# Patient Record
Sex: Male | Born: 1940
Health system: Southern US, Community
[De-identification: ages and names within clinical notes are randomized; demographics above are authoritative.]

## PROBLEM LIST (undated history)

## (undated) DIAGNOSIS — M109 Gout, unspecified: Secondary | ICD-10-CM

## (undated) DIAGNOSIS — I10 Essential (primary) hypertension: Secondary | ICD-10-CM

## (undated) DIAGNOSIS — E7800 Pure hypercholesterolemia, unspecified: Secondary | ICD-10-CM

## (undated) HISTORY — PX: SHOULDER SURGERY: SHX246

## (undated) HISTORY — PX: KNEE SURGERY: SHX244

---

## 2002-08-28 ENCOUNTER — Ambulatory Visit (HOSPITAL_COMMUNITY): Admission: RE | Admit: 2002-08-28 | Discharge: 2002-08-28 | Payer: Self-pay | Admitting: General Surgery

## 2009-09-20 ENCOUNTER — Ambulatory Visit: Payer: Self-pay | Admitting: Cardiology

## 2014-02-26 DIAGNOSIS — I10 Essential (primary) hypertension: Secondary | ICD-10-CM | POA: Diagnosis not present

## 2014-02-26 DIAGNOSIS — S9032XA Contusion of left foot, initial encounter: Secondary | ICD-10-CM | POA: Diagnosis not present

## 2014-03-12 DIAGNOSIS — N3000 Acute cystitis without hematuria: Secondary | ICD-10-CM | POA: Diagnosis not present

## 2014-03-12 DIAGNOSIS — R61 Generalized hyperhidrosis: Secondary | ICD-10-CM | POA: Diagnosis not present

## 2014-03-12 DIAGNOSIS — R110 Nausea: Secondary | ICD-10-CM | POA: Diagnosis not present

## 2014-03-12 DIAGNOSIS — Z7982 Long term (current) use of aspirin: Secondary | ICD-10-CM | POA: Diagnosis not present

## 2014-03-12 DIAGNOSIS — N390 Urinary tract infection, site not specified: Secondary | ICD-10-CM | POA: Diagnosis not present

## 2014-03-12 DIAGNOSIS — R42 Dizziness and giddiness: Secondary | ICD-10-CM | POA: Diagnosis not present

## 2014-03-12 DIAGNOSIS — R55 Syncope and collapse: Secondary | ICD-10-CM | POA: Diagnosis not present

## 2014-03-12 DIAGNOSIS — Z87442 Personal history of urinary calculi: Secondary | ICD-10-CM | POA: Diagnosis not present

## 2014-03-12 DIAGNOSIS — Z8673 Personal history of transient ischemic attack (TIA), and cerebral infarction without residual deficits: Secondary | ICD-10-CM | POA: Diagnosis not present

## 2014-03-12 DIAGNOSIS — I10 Essential (primary) hypertension: Secondary | ICD-10-CM | POA: Diagnosis not present

## 2014-03-12 DIAGNOSIS — M2140 Flat foot [pes planus] (acquired), unspecified foot: Secondary | ICD-10-CM | POA: Diagnosis not present

## 2014-03-12 DIAGNOSIS — R001 Bradycardia, unspecified: Secondary | ICD-10-CM | POA: Diagnosis not present

## 2014-03-12 DIAGNOSIS — M25562 Pain in left knee: Secondary | ICD-10-CM | POA: Diagnosis not present

## 2014-03-12 DIAGNOSIS — R748 Abnormal levels of other serum enzymes: Secondary | ICD-10-CM | POA: Diagnosis not present

## 2014-03-12 DIAGNOSIS — E780 Pure hypercholesterolemia: Secondary | ICD-10-CM | POA: Diagnosis not present

## 2014-03-12 DIAGNOSIS — Z881 Allergy status to other antibiotic agents status: Secondary | ICD-10-CM | POA: Diagnosis not present

## 2014-03-12 DIAGNOSIS — M179 Osteoarthritis of knee, unspecified: Secondary | ICD-10-CM | POA: Diagnosis not present

## 2014-03-12 DIAGNOSIS — N289 Disorder of kidney and ureter, unspecified: Secondary | ICD-10-CM | POA: Diagnosis not present

## 2014-03-12 DIAGNOSIS — N19 Unspecified kidney failure: Secondary | ICD-10-CM | POA: Diagnosis not present

## 2014-03-12 DIAGNOSIS — Z880 Allergy status to penicillin: Secondary | ICD-10-CM | POA: Diagnosis not present

## 2014-03-12 DIAGNOSIS — M109 Gout, unspecified: Secondary | ICD-10-CM | POA: Diagnosis not present

## 2014-03-13 DIAGNOSIS — M19072 Primary osteoarthritis, left ankle and foot: Secondary | ICD-10-CM | POA: Diagnosis not present

## 2014-03-13 DIAGNOSIS — N390 Urinary tract infection, site not specified: Secondary | ICD-10-CM | POA: Diagnosis not present

## 2014-03-13 DIAGNOSIS — M25472 Effusion, left ankle: Secondary | ICD-10-CM | POA: Diagnosis not present

## 2014-03-14 DIAGNOSIS — N390 Urinary tract infection, site not specified: Secondary | ICD-10-CM | POA: Diagnosis not present

## 2014-03-21 DIAGNOSIS — S82892G Other fracture of left lower leg, subsequent encounter for closed fracture with delayed healing: Secondary | ICD-10-CM | POA: Diagnosis not present

## 2014-03-21 DIAGNOSIS — E780 Pure hypercholesterolemia: Secondary | ICD-10-CM | POA: Diagnosis not present

## 2014-03-21 DIAGNOSIS — M109 Gout, unspecified: Secondary | ICD-10-CM | POA: Diagnosis not present

## 2014-03-21 DIAGNOSIS — I10 Essential (primary) hypertension: Secondary | ICD-10-CM | POA: Diagnosis not present

## 2014-04-10 DIAGNOSIS — M7662 Achilles tendinitis, left leg: Secondary | ICD-10-CM | POA: Diagnosis not present

## 2014-04-16 DIAGNOSIS — R262 Difficulty in walking, not elsewhere classified: Secondary | ICD-10-CM | POA: Diagnosis not present

## 2014-04-16 DIAGNOSIS — M7662 Achilles tendinitis, left leg: Secondary | ICD-10-CM | POA: Diagnosis not present

## 2014-04-16 DIAGNOSIS — M25572 Pain in left ankle and joints of left foot: Secondary | ICD-10-CM | POA: Diagnosis not present

## 2014-04-16 DIAGNOSIS — M6281 Muscle weakness (generalized): Secondary | ICD-10-CM | POA: Diagnosis not present

## 2014-04-18 DIAGNOSIS — R262 Difficulty in walking, not elsewhere classified: Secondary | ICD-10-CM | POA: Diagnosis not present

## 2014-04-18 DIAGNOSIS — M25572 Pain in left ankle and joints of left foot: Secondary | ICD-10-CM | POA: Diagnosis not present

## 2014-04-18 DIAGNOSIS — M7662 Achilles tendinitis, left leg: Secondary | ICD-10-CM | POA: Diagnosis not present

## 2014-04-18 DIAGNOSIS — M6281 Muscle weakness (generalized): Secondary | ICD-10-CM | POA: Diagnosis not present

## 2014-04-23 DIAGNOSIS — M6281 Muscle weakness (generalized): Secondary | ICD-10-CM | POA: Diagnosis not present

## 2014-04-23 DIAGNOSIS — M25572 Pain in left ankle and joints of left foot: Secondary | ICD-10-CM | POA: Diagnosis not present

## 2014-04-23 DIAGNOSIS — M7662 Achilles tendinitis, left leg: Secondary | ICD-10-CM | POA: Diagnosis not present

## 2014-04-23 DIAGNOSIS — R262 Difficulty in walking, not elsewhere classified: Secondary | ICD-10-CM | POA: Diagnosis not present

## 2014-04-30 DIAGNOSIS — M6281 Muscle weakness (generalized): Secondary | ICD-10-CM | POA: Diagnosis not present

## 2014-04-30 DIAGNOSIS — R262 Difficulty in walking, not elsewhere classified: Secondary | ICD-10-CM | POA: Diagnosis not present

## 2014-04-30 DIAGNOSIS — M7662 Achilles tendinitis, left leg: Secondary | ICD-10-CM | POA: Diagnosis not present

## 2014-04-30 DIAGNOSIS — M25572 Pain in left ankle and joints of left foot: Secondary | ICD-10-CM | POA: Diagnosis not present

## 2014-05-03 DIAGNOSIS — E780 Pure hypercholesterolemia: Secondary | ICD-10-CM | POA: Diagnosis not present

## 2014-05-03 DIAGNOSIS — I10 Essential (primary) hypertension: Secondary | ICD-10-CM | POA: Diagnosis not present

## 2014-05-03 DIAGNOSIS — M109 Gout, unspecified: Secondary | ICD-10-CM | POA: Diagnosis not present

## 2014-05-07 DIAGNOSIS — M7662 Achilles tendinitis, left leg: Secondary | ICD-10-CM | POA: Diagnosis not present

## 2014-05-07 DIAGNOSIS — M6281 Muscle weakness (generalized): Secondary | ICD-10-CM | POA: Diagnosis not present

## 2014-05-07 DIAGNOSIS — M25572 Pain in left ankle and joints of left foot: Secondary | ICD-10-CM | POA: Diagnosis not present

## 2014-05-07 DIAGNOSIS — R262 Difficulty in walking, not elsewhere classified: Secondary | ICD-10-CM | POA: Diagnosis not present

## 2014-05-17 DIAGNOSIS — H40053 Ocular hypertension, bilateral: Secondary | ICD-10-CM | POA: Diagnosis not present

## 2014-06-05 DIAGNOSIS — E780 Pure hypercholesterolemia: Secondary | ICD-10-CM | POA: Diagnosis not present

## 2014-06-05 DIAGNOSIS — Z7689 Persons encountering health services in other specified circumstances: Secondary | ICD-10-CM | POA: Diagnosis not present

## 2014-06-11 DIAGNOSIS — I10 Essential (primary) hypertension: Secondary | ICD-10-CM | POA: Diagnosis not present

## 2014-06-11 DIAGNOSIS — E780 Pure hypercholesterolemia: Secondary | ICD-10-CM | POA: Diagnosis not present

## 2014-06-11 DIAGNOSIS — M109 Gout, unspecified: Secondary | ICD-10-CM | POA: Diagnosis not present

## 2014-10-16 DIAGNOSIS — E780 Pure hypercholesterolemia: Secondary | ICD-10-CM | POA: Diagnosis not present

## 2014-10-16 DIAGNOSIS — M109 Gout, unspecified: Secondary | ICD-10-CM | POA: Diagnosis not present

## 2014-10-16 DIAGNOSIS — I639 Cerebral infarction, unspecified: Secondary | ICD-10-CM | POA: Diagnosis not present

## 2014-10-16 DIAGNOSIS — I10 Essential (primary) hypertension: Secondary | ICD-10-CM | POA: Diagnosis not present

## 2015-01-22 DIAGNOSIS — M109 Gout, unspecified: Secondary | ICD-10-CM | POA: Diagnosis not present

## 2015-01-22 DIAGNOSIS — I10 Essential (primary) hypertension: Secondary | ICD-10-CM | POA: Diagnosis not present

## 2015-01-22 DIAGNOSIS — Z1389 Encounter for screening for other disorder: Secondary | ICD-10-CM | POA: Diagnosis not present

## 2015-01-22 DIAGNOSIS — R05 Cough: Secondary | ICD-10-CM | POA: Diagnosis not present

## 2015-01-22 DIAGNOSIS — Z13220 Encounter for screening for lipoid disorders: Secondary | ICD-10-CM | POA: Diagnosis not present

## 2015-05-08 DIAGNOSIS — M1990 Unspecified osteoarthritis, unspecified site: Secondary | ICD-10-CM | POA: Diagnosis not present

## 2015-05-08 DIAGNOSIS — I10 Essential (primary) hypertension: Secondary | ICD-10-CM | POA: Diagnosis not present

## 2015-05-08 DIAGNOSIS — E7800 Pure hypercholesterolemia, unspecified: Secondary | ICD-10-CM | POA: Diagnosis not present

## 2015-05-08 DIAGNOSIS — M109 Gout, unspecified: Secondary | ICD-10-CM | POA: Diagnosis not present

## 2015-05-15 DIAGNOSIS — Z0001 Encounter for general adult medical examination with abnormal findings: Secondary | ICD-10-CM | POA: Diagnosis not present

## 2015-05-15 DIAGNOSIS — I10 Essential (primary) hypertension: Secondary | ICD-10-CM | POA: Diagnosis not present

## 2015-12-05 DIAGNOSIS — R05 Cough: Secondary | ICD-10-CM | POA: Diagnosis not present

## 2015-12-05 DIAGNOSIS — I10 Essential (primary) hypertension: Secondary | ICD-10-CM | POA: Diagnosis not present

## 2015-12-05 DIAGNOSIS — M1990 Unspecified osteoarthritis, unspecified site: Secondary | ICD-10-CM | POA: Diagnosis not present

## 2015-12-05 DIAGNOSIS — I639 Cerebral infarction, unspecified: Secondary | ICD-10-CM | POA: Diagnosis not present

## 2016-01-17 DIAGNOSIS — J209 Acute bronchitis, unspecified: Secondary | ICD-10-CM | POA: Diagnosis not present

## 2016-04-08 DIAGNOSIS — M1990 Unspecified osteoarthritis, unspecified site: Secondary | ICD-10-CM | POA: Diagnosis not present

## 2016-04-08 DIAGNOSIS — R7989 Other specified abnormal findings of blood chemistry: Secondary | ICD-10-CM | POA: Diagnosis not present

## 2016-04-08 DIAGNOSIS — I10 Essential (primary) hypertension: Secondary | ICD-10-CM | POA: Diagnosis not present

## 2016-04-08 DIAGNOSIS — E7800 Pure hypercholesterolemia, unspecified: Secondary | ICD-10-CM | POA: Diagnosis not present

## 2016-04-08 DIAGNOSIS — I639 Cerebral infarction, unspecified: Secondary | ICD-10-CM | POA: Diagnosis not present

## 2016-04-10 DIAGNOSIS — I639 Cerebral infarction, unspecified: Secondary | ICD-10-CM | POA: Diagnosis not present

## 2016-04-10 DIAGNOSIS — J42 Unspecified chronic bronchitis: Secondary | ICD-10-CM | POA: Diagnosis not present

## 2016-04-10 DIAGNOSIS — M1990 Unspecified osteoarthritis, unspecified site: Secondary | ICD-10-CM | POA: Diagnosis not present

## 2016-04-10 DIAGNOSIS — I10 Essential (primary) hypertension: Secondary | ICD-10-CM | POA: Diagnosis not present

## 2016-04-27 DIAGNOSIS — H40053 Ocular hypertension, bilateral: Secondary | ICD-10-CM | POA: Diagnosis not present

## 2016-04-27 DIAGNOSIS — H524 Presbyopia: Secondary | ICD-10-CM | POA: Diagnosis not present

## 2016-06-05 DIAGNOSIS — R197 Diarrhea, unspecified: Secondary | ICD-10-CM | POA: Diagnosis not present

## 2017-11-15 DIAGNOSIS — H2513 Age-related nuclear cataract, bilateral: Secondary | ICD-10-CM | POA: Diagnosis not present

## 2017-11-15 DIAGNOSIS — H527 Unspecified disorder of refraction: Secondary | ICD-10-CM | POA: Diagnosis not present

## 2017-11-15 DIAGNOSIS — H1010 Acute atopic conjunctivitis, unspecified eye: Secondary | ICD-10-CM | POA: Diagnosis not present

## 2017-11-15 DIAGNOSIS — H40003 Preglaucoma, unspecified, bilateral: Secondary | ICD-10-CM | POA: Diagnosis not present

## 2018-04-20 DIAGNOSIS — M545 Low back pain: Secondary | ICD-10-CM | POA: Diagnosis not present

## 2019-02-05 DIAGNOSIS — Z881 Allergy status to other antibiotic agents status: Secondary | ICD-10-CM | POA: Diagnosis not present

## 2019-02-05 DIAGNOSIS — R001 Bradycardia, unspecified: Secondary | ICD-10-CM | POA: Diagnosis not present

## 2019-02-05 DIAGNOSIS — R05 Cough: Secondary | ICD-10-CM | POA: Diagnosis not present

## 2019-02-05 DIAGNOSIS — Z79899 Other long term (current) drug therapy: Secondary | ICD-10-CM | POA: Diagnosis not present

## 2019-02-05 DIAGNOSIS — I10 Essential (primary) hypertension: Secondary | ICD-10-CM | POA: Diagnosis not present

## 2019-02-05 DIAGNOSIS — R791 Abnormal coagulation profile: Secondary | ICD-10-CM | POA: Diagnosis not present

## 2019-02-05 DIAGNOSIS — R7989 Other specified abnormal findings of blood chemistry: Secondary | ICD-10-CM | POA: Diagnosis not present

## 2019-02-05 DIAGNOSIS — E7800 Pure hypercholesterolemia, unspecified: Secondary | ICD-10-CM | POA: Diagnosis not present

## 2019-02-05 DIAGNOSIS — I160 Hypertensive urgency: Secondary | ICD-10-CM | POA: Diagnosis not present

## 2019-02-05 DIAGNOSIS — Z8673 Personal history of transient ischemic attack (TIA), and cerebral infarction without residual deficits: Secondary | ICD-10-CM | POA: Diagnosis not present

## 2019-02-05 DIAGNOSIS — R0602 Shortness of breath: Secondary | ICD-10-CM | POA: Diagnosis not present

## 2019-02-05 DIAGNOSIS — R778 Other specified abnormalities of plasma proteins: Secondary | ICD-10-CM | POA: Diagnosis not present

## 2019-02-05 DIAGNOSIS — Z880 Allergy status to penicillin: Secondary | ICD-10-CM | POA: Diagnosis not present

## 2019-02-05 DIAGNOSIS — Z87891 Personal history of nicotine dependence: Secondary | ICD-10-CM | POA: Diagnosis not present

## 2019-02-05 DIAGNOSIS — K219 Gastro-esophageal reflux disease without esophagitis: Secondary | ICD-10-CM | POA: Diagnosis not present

## 2019-02-06 DIAGNOSIS — R0602 Shortness of breath: Secondary | ICD-10-CM | POA: Diagnosis not present

## 2019-02-07 ENCOUNTER — Inpatient Hospital Stay (HOSPITAL_COMMUNITY)
Admission: AD | Admit: 2019-02-07 | Discharge: 2019-02-09 | DRG: 293 | Disposition: A | Discharge status: Home / Self Care | Payer: Medicare Other | Source: Other Acute Inpatient Hospital | Attending: Cardiology | Admitting: Cardiology

## 2019-02-07 ENCOUNTER — Inpatient Hospital Stay (HOSPITAL_COMMUNITY): Payer: Medicare Other

## 2019-02-07 DIAGNOSIS — Z8673 Personal history of transient ischemic attack (TIA), and cerebral infarction without residual deficits: Secondary | ICD-10-CM

## 2019-02-07 DIAGNOSIS — M109 Gout, unspecified: Secondary | ICD-10-CM | POA: Diagnosis not present

## 2019-02-07 DIAGNOSIS — I110 Hypertensive heart disease with heart failure: Principal | ICD-10-CM

## 2019-02-07 DIAGNOSIS — Z7982 Long term (current) use of aspirin: Secondary | ICD-10-CM

## 2019-02-07 DIAGNOSIS — I088 Other rheumatic multiple valve diseases: Secondary | ICD-10-CM | POA: Diagnosis present

## 2019-02-07 DIAGNOSIS — I350 Nonrheumatic aortic (valve) stenosis: Secondary | ICD-10-CM

## 2019-02-07 DIAGNOSIS — Z881 Allergy status to other antibiotic agents status: Secondary | ICD-10-CM

## 2019-02-07 DIAGNOSIS — I10 Essential (primary) hypertension: Secondary | ICD-10-CM | POA: Diagnosis not present

## 2019-02-07 DIAGNOSIS — M79605 Pain in left leg: Secondary | ICD-10-CM | POA: Diagnosis not present

## 2019-02-07 DIAGNOSIS — R001 Bradycardia, unspecified: Secondary | ICD-10-CM | POA: Diagnosis not present

## 2019-02-07 DIAGNOSIS — I351 Nonrheumatic aortic (valve) insufficiency: Secondary | ICD-10-CM | POA: Diagnosis not present

## 2019-02-07 DIAGNOSIS — M79604 Pain in right leg: Secondary | ICD-10-CM

## 2019-02-07 DIAGNOSIS — I160 Hypertensive urgency: Secondary | ICD-10-CM | POA: Diagnosis not present

## 2019-02-07 DIAGNOSIS — I509 Heart failure, unspecified: Secondary | ICD-10-CM

## 2019-02-07 DIAGNOSIS — I5030 Unspecified diastolic (congestive) heart failure: Secondary | ICD-10-CM | POA: Diagnosis not present

## 2019-02-07 DIAGNOSIS — I5033 Acute on chronic diastolic (congestive) heart failure: Secondary | ICD-10-CM | POA: Diagnosis present

## 2019-02-07 DIAGNOSIS — R058 Other specified cough: Secondary | ICD-10-CM | POA: Diagnosis present

## 2019-02-07 DIAGNOSIS — Z880 Allergy status to penicillin: Secondary | ICD-10-CM | POA: Diagnosis not present

## 2019-02-07 DIAGNOSIS — M170 Bilateral primary osteoarthritis of knee: Secondary | ICD-10-CM | POA: Diagnosis not present

## 2019-02-07 DIAGNOSIS — R7989 Other specified abnormal findings of blood chemistry: Secondary | ICD-10-CM | POA: Diagnosis not present

## 2019-02-07 DIAGNOSIS — Z6834 Body mass index (BMI) 34.0-34.9, adult: Secondary | ICD-10-CM

## 2019-02-07 DIAGNOSIS — J90 Pleural effusion, not elsewhere classified: Secondary | ICD-10-CM | POA: Diagnosis not present

## 2019-02-07 DIAGNOSIS — I2510 Atherosclerotic heart disease of native coronary artery without angina pectoris: Secondary | ICD-10-CM | POA: Diagnosis not present

## 2019-02-07 DIAGNOSIS — R05 Cough: Secondary | ICD-10-CM | POA: Diagnosis not present

## 2019-02-07 DIAGNOSIS — E785 Hyperlipidemia, unspecified: Secondary | ICD-10-CM | POA: Diagnosis not present

## 2019-02-07 DIAGNOSIS — R0602 Shortness of breath: Secondary | ICD-10-CM | POA: Diagnosis not present

## 2019-02-07 DIAGNOSIS — Z79899 Other long term (current) drug therapy: Secondary | ICD-10-CM | POA: Diagnosis not present

## 2019-02-07 DIAGNOSIS — E669 Obesity, unspecified: Secondary | ICD-10-CM | POA: Diagnosis present

## 2019-02-07 LAB — CBC WITH DIFFERENTIAL/PLATELET
Abs Immature Granulocytes: 0.05 10*3/uL (ref 0.00–0.07)
Basophils Absolute: 0 10*3/uL (ref 0.0–0.1)
Basophils Relative: 0 %
Eosinophils Absolute: 0 10*3/uL (ref 0.0–0.5)
Eosinophils Relative: 0 %
HCT: 37.2 % — ABNORMAL LOW (ref 39.0–52.0)
Hemoglobin: 12.2 g/dL — ABNORMAL LOW (ref 13.0–17.0)
Immature Granulocytes: 1 %
Lymphocytes Relative: 13 %
Lymphs Abs: 1.3 10*3/uL (ref 0.7–4.0)
MCH: 32.2 pg (ref 26.0–34.0)
MCHC: 32.8 g/dL (ref 30.0–36.0)
MCV: 98.2 fL (ref 80.0–100.0)
Monocytes Absolute: 0.7 10*3/uL (ref 0.1–1.0)
Monocytes Relative: 7 %
Neutro Abs: 7.9 10*3/uL — ABNORMAL HIGH (ref 1.7–7.7)
Neutrophils Relative %: 79 %
Platelets: 301 10*3/uL (ref 150–400)
RBC: 3.79 MIL/uL — ABNORMAL LOW (ref 4.22–5.81)
RDW: 13.1 % (ref 11.5–15.5)
WBC: 9.9 10*3/uL (ref 4.0–10.5)
nRBC: 0 % (ref 0.0–0.2)

## 2019-02-07 LAB — HEMOGLOBIN A1C
Hgb A1c MFr Bld: 5.4 % (ref 4.8–5.6)
Mean Plasma Glucose: 108.28 mg/dL

## 2019-02-07 LAB — COMPREHENSIVE METABOLIC PANEL
ALT: 17 U/L (ref 0–44)
AST: 21 U/L (ref 15–41)
Albumin: 3.3 g/dL — ABNORMAL LOW (ref 3.5–5.0)
Alkaline Phosphatase: 114 U/L (ref 38–126)
Anion gap: 8 (ref 5–15)
BUN: 27 mg/dL — ABNORMAL HIGH (ref 8–23)
CO2: 27 mmol/L (ref 22–32)
Calcium: 9.1 mg/dL (ref 8.9–10.3)
Chloride: 107 mmol/L (ref 98–111)
Creatinine, Ser: 1.67 mg/dL — ABNORMAL HIGH (ref 0.61–1.24)
GFR calc Af Amer: 45 mL/min — ABNORMAL LOW (ref >60)
GFR calc non Af Amer: 39 mL/min — ABNORMAL LOW (ref >60)
Glucose, Bld: 106 mg/dL — ABNORMAL HIGH (ref 70–99)
Potassium: 4.2 mmol/L (ref 3.5–5.1)
Sodium: 142 mmol/L (ref 135–145)
Total Bilirubin: 0.6 mg/dL (ref 0.3–1.2)
Total Protein: 6.9 g/dL (ref 6.5–8.1)

## 2019-02-07 LAB — TROPONIN I (HIGH SENSITIVITY)
Troponin I (High Sensitivity): 31 ng/L — ABNORMAL HIGH (ref <18)
Troponin I (High Sensitivity): 31 ng/L — ABNORMAL HIGH (ref <18)

## 2019-02-07 LAB — BRAIN NATRIURETIC PEPTIDE: B Natriuretic Peptide: 286.5 pg/mL — ABNORMAL HIGH (ref 0.0–100.0)

## 2019-02-07 LAB — MAGNESIUM: Magnesium: 1.8 mg/dL (ref 1.7–2.4)

## 2019-02-07 LAB — ECHOCARDIOGRAM COMPLETE
Height: 79 in
Weight: 5043.2 oz

## 2019-02-07 LAB — LIPID PANEL
Cholesterol: 196 mg/dL (ref 0–200)
HDL: 38 mg/dL — ABNORMAL LOW (ref >40)
LDL Cholesterol: 146 mg/dL — ABNORMAL HIGH (ref 0–99)
Total CHOL/HDL Ratio: 5.2 RATIO
Triglycerides: 59 mg/dL (ref <150)
VLDL: 12 mg/dL (ref 0–40)

## 2019-02-07 LAB — TSH: TSH: 0.636 u[IU]/mL (ref 0.350–4.500)

## 2019-02-07 MED ORDER — ROSUVASTATIN CALCIUM 20 MG PO TABS
20.0000 mg | ORAL_TABLET | Freq: Every day | ORAL | Status: DC
  Administered 2019-02-07: 20 mg via ORAL
  Filled 2019-02-07: qty 1

## 2019-02-07 MED ORDER — ALLOPURINOL 300 MG PO TABS
300.0000 mg | ORAL_TABLET | Freq: Every day | ORAL | Status: DC
  Administered 2019-02-07 – 2019-02-09 (×3): 300 mg via ORAL
  Filled 2019-02-07 (×3): qty 1

## 2019-02-07 MED ORDER — SODIUM CHLORIDE 0.9% FLUSH
3.0000 mL | Freq: Two times a day (BID) | INTRAVENOUS | Status: DC
  Administered 2019-02-07 – 2019-02-08 (×4): 3 mL via INTRAVENOUS

## 2019-02-07 MED ORDER — LOSARTAN POTASSIUM 50 MG PO TABS
100.0000 mg | ORAL_TABLET | Freq: Every day | ORAL | Status: DC
  Administered 2019-02-07 – 2019-02-09 (×2): 100 mg via ORAL
  Filled 2019-02-07 (×3): qty 2

## 2019-02-07 MED ORDER — ACETAMINOPHEN 325 MG PO TABS: 650.0000 mg | ORAL_TABLET | ORAL | Status: DC | PRN

## 2019-02-07 MED ORDER — SERTRALINE HCL 25 MG PO TABS
12.5000 mg | ORAL_TABLET | Freq: Every day | ORAL | Status: DC
  Administered 2019-02-07 – 2019-02-09 (×3): 12.5 mg via ORAL
  Filled 2019-02-07 (×3): qty 0.5

## 2019-02-07 MED ORDER — SODIUM CHLORIDE 0.9% FLUSH: 3.0000 mL | INTRAVENOUS | Status: DC | PRN

## 2019-02-07 MED ORDER — IOHEXOL 350 MG/ML SOLN
75.0000 mL | Freq: Once | INTRAVENOUS | Status: AC | PRN
  Administered 2019-02-07: 75 mL via INTRAVENOUS

## 2019-02-07 MED ORDER — ASPIRIN EC 81 MG PO TBEC
81.0000 mg | DELAYED_RELEASE_TABLET | Freq: Every day | ORAL | Status: DC
  Administered 2019-02-07 – 2019-02-09 (×3): 81 mg via ORAL
  Filled 2019-02-07 (×3): qty 1

## 2019-02-07 MED ORDER — AMLODIPINE BESYLATE 5 MG PO TABS
5.0000 mg | ORAL_TABLET | Freq: Every day | ORAL | Status: DC
  Administered 2019-02-07: 5 mg via ORAL
  Filled 2019-02-07 (×2): qty 1

## 2019-02-07 MED ORDER — SODIUM CHLORIDE 0.9 % IV SOLN: 250.0000 mL | INTRAVENOUS | Status: DC | PRN

## 2019-02-07 MED ORDER — METOPROLOL SUCCINATE ER 50 MG PO TB24
50.0000 mg | ORAL_TABLET | Freq: Every day | ORAL | Status: DC
  Administered 2019-02-07: 50 mg via ORAL
  Filled 2019-02-07 (×2): qty 1

## 2019-02-07 MED ORDER — FUROSEMIDE 10 MG/ML IJ SOLN
20.0000 mg | Freq: Once | INTRAMUSCULAR | Status: AC
  Administered 2019-02-07: 20 mg via INTRAVENOUS
  Filled 2019-02-07: qty 2

## 2019-02-07 MED ORDER — HEPARIN SODIUM (PORCINE) 5000 UNIT/ML IJ SOLN
5000.0000 [IU] | Freq: Three times a day (TID) | INTRAMUSCULAR | Status: DC
  Administered 2019-02-07 – 2019-02-09 (×7): 5000 [IU] via SUBCUTANEOUS
  Filled 2019-02-07 (×7): qty 1

## 2019-02-07 NOTE — Progress Notes (Signed)
Bilateral lower extremity venous duplex has been completed. Preliminary results can be found in CV Proc through chart review.   02/07/19 1:52 PM Tyler Macdonald RVT

## 2019-02-07 NOTE — H&P (Signed)
Cardiology Admission History and Physical:   Patient ID: Tyler Macdonald MRN: [id number]; DOB: Dec 29, 1940   Admission date: 02/07/2019  Primary Care Provider: Rory Percy, MD Primary Cardiologist: No primary care provider on file.  Primary Electrophysiologist:  None   Chief Complaint:  Shortness of breath  Patient Profile:   Tyler Macdonald is a 78 y.o. male with a history of HTN and prior CVA who presents with worsening SOB.   History of Present Illness:   Tyler Macdonald was in his usual state of health - ambulatory, but frequently relying on a motorized scooter due to bilateral knee OA, until this Sunday. At that point, he awoke with significant shortness of breath feeling like he was "suffocating" he drove himself to [facility] where he was found to be HTNsive to 200s/80s though not hypoxic. In the ED there, labs were remarkable for an elevated d-dimer and a troponin of 0.04 - 0.05. He was treated with 20mg  IV lasix, IV hydralazine, and nitro paste for HTNsive urgency. Because of the d-dimer, a CTA was ordered but could not be performed, and he was empirically given 1 dose of therapeutic lovenox.   Of note, patient was previously on HCTZ, but this was discontinued 2 weeks ago due to nocturia.   On review of system, patient without fevers, or chills. He has developed a cough productive of white phlegm over the past 3-4 months. He had some mild CP on the day of his presentation but no anginal symptoms previously. No orthopnea. Did notice that he has had some worsening bilateral ankle edema and this week has noted that the R > L.   Patient Active Problem List   Diagnosis Date Noted  . Essential hypertension 02/07/2019  . Cough with sputum 02/07/2019  . Gout 02/07/2019  . Shortness of breath 02/07/2019    Medications Prior to Admission: Current Outpatient Medications  Medication Instructions  . allopurinol (ZYLOPRIM) 300 mg, Oral, Daily  . aspirin EC 81 mg, Oral, Daily  .  losartan (COZAAR) 100 mg, Oral, Daily  . metoprolol succinate (TOPROL-XL) 75 mg, Oral, 2 times daily, Take with or immediately following a meal.   . sertraline (ZOLOFT) 12.5 mg, Oral, Daily   Allergies:    Allergies  Allergen Reactions  . Cephalexin   . Penicillin G     Social History:   Social History   Socioeconomic History  . Marital status: Divorced    Spouse name: Not on file  . Number of children: Not on file  . Years of education: Not on file  . Highest education level: Not on file  Occupational History  . Not on file  Tobacco Use  . Smoking status: Not on file  Substance and Sexual Activity  . Alcohol use: Not on file  . Drug use: Not on file  . Sexual activity: Not on file  Other Topics Concern  . Not on file  Social History Narrative  . Not on file   Social Determinants of Health   Financial Resource Strain:   . Difficulty of Paying Living Expenses: Not on file  Food Insecurity:   . Worried About Charity fundraiser in the Last Year: Not on file  . Ran Out of Food in the Last Year: Not on file  Transportation Needs:   . Lack of Transportation (Medical): Not on file  . Lack of Transportation (Non-Medical): Not on file  Physical Activity:   . Days of Exercise per Week: Not on file  .  Minutes of Exercise per Session: Not on file  Stress:   . Feeling of Stress : Not on file  Social Connections:   . Frequency of Communication with Friends and Family: Not on file  . Frequency of Social Gatherings with Friends and Family: Not on file  . Attends Religious Services: Not on file  . Active Member of Clubs or Organizations: Not on file  . Attends BankerClub or Organization Meetings: Not on file  . Marital Status: Not on file  Intimate Partner Violence:   . Fear of Current or Ex-Partner: Not on file  . Emotionally Abused: Not on file  . Physically Abused: Not on file  . Sexually Abused: Not on file    Family History:   The patient's family history is not on file.     Review of Systems: [y] = yes, [ ]  = no     General: Weight gain [ ] ; Weight loss [ ] ; Anorexia [ ] ; Fatigue [ ] ; Fever [ ] ; Chills [ ] ; Weakness [ ]    Cardiac: Chest pain/pressure [ ] ; Resting SOB Cove.Etienne[y ]; Exertional SOB [ ] ; Orthopnea [ ] ; Pedal Edema [ y]; Palpitations [ ] ; Syncope [ ] ; Presyncope [ ] ; Paroxysmal nocturnal dyspnea[ ]    Pulmonary: Cough Cove.Etienne[y ]; Wheezing[ ] ; Hemoptysis[ ] ; Sputum [ ] ; Snoring [ ]    GI: Vomiting[ ] ; Dysphagia[ ] ; Melena[ ] ; Hematochezia [ ] ; Heartburn[ ] ; Abdominal pain [ ] ; Constipation [ ] ; Diarrhea [ ] ; BRBPR [ ]    GU: Hematuria[ ] ; Dysuria [ ] ; Nocturia[ ]    Vascular: Pain in legs with walking [ ] ; Pain in feet with lying flat [ ] ; Non-healing sores [ ] ; Stroke [ ] ; TIA [ ] ; Slurred speech [ ] ;   Neuro: Headaches[ ] ; Vertigo[ ] ; Seizures[ ] ; Paresthesias[ ] ;Blurred vision [ ] ; Diplopia [ ] ; Vision changes [ ]    Ortho/Skin: Arthritis [ ] ; Joint pain [ ] ; Muscle pain [ ] ; Joint swelling [ ] ; Back Pain [ ] ; Rash [ ]    Psych: Depression[ ] ; Anxiety[ ]    Heme: Bleeding problems [ ] ; Clotting disorders [ ] ; Anemia [ ]    Endocrine: Diabetes [ ] ; Thyroid dysfunction[ ]   Physical Exam/Data:   Vitals:   02/07/19 0417  BP: (!) 168/65  Pulse: (!) 48  Resp: 17  Temp: 98 F (36.7 C)  TempSrc: Oral  SpO2: 97%  Weight: (!) 143 kg  Height: 6\' 7"  (2.007 m)   General:  Well appearing obese man, in NAD.  HEENT: normal Lymph: no adenopathy Neck: no JVD. Prominent carotid upstroke.  Vascular: No carotid bruits; FA pulses 2+ bilaterally without bruits  Cardiac:  Bradycardic. Faint systolic murmur.  Lungs:  clear to auscultation bilaterally, no wheezing, no rales. High pitched expiratory wheeze in the upper airway. Frequent coughing.   Abd: soft, nontender, no hepatomegaly  Ext: 1+ pitting edema bilaterally. R>L Musculoskeletal:  No deformities, BUE and BLE strength normal and equal Skin: warm and dry  Neuro:  CNs 2-12 intact, no focal abnormalities  noted Psych:  Normal affect   EKG:  The ECG that was done and demonstrated sinus bradycardia, normal axis.   Relevant CV Studies: n/a  Laboratory Data:  OSH Labs: ABG: 7.47/30/107 (unclear O2 - ? 2L) Troponin: 0.04 UA: mild proteinuria BNP: 3924 D-Dimer: 1.84 Lactate: 1.3 CBC: WBC 6.4 Hgb 12.4 Plt 268  Chem 8: Na 140 K 4.5 AG 16 BUN 14 Cr 1.22 T Bili 0.7 AST/ALT 15/12 Alb 3.8 COVID Negative  ChemistryNo results  for input(s): NA, K, CL, CO2, GLUCOSE, BUN, CREATININE, CALCIUM, GFRNONAA, GFRAA, ANIONGAP in the last 168 hours.  No results for input(s): PROT, ALBUMIN, AST, ALT, ALKPHOS, BILITOT in the last 168 hours. HematologyNo results for input(s): WBC, RBC, HGB, HCT, MCV, MCH, MCHC, RDW, PLT in the last 168 hours. Cardiac EnzymesNo results for input(s): TROPONINI in the last 168 hours. No results for input(s): TROPIPOC in the last 168 hours.  BNPNo results for input(s): BNP, PROBNP in the last 168 hours.  DDimer No results for input(s): DDIMER in the last 168 hours.  Radiology/Studies:  No results found.  Assessment and Plan:   Mr. Deardorff is a 79 year old man who presents with acute onset SOB in the context of 3-4 months of productive cough and LE swelling, found to be hypertensive to 200s. Patient's symptoms and BP have responded to IV diuresis, IV hydralazine. Differential diagnosis includes PE given OSH labs of elevated d-dimer and borderline hypoxia, v. New onset CHF given elevated BNP and response to IV diuretics. Suspect, particularly in the latter case, poorly controlled HTNsion playing a role. Underlying lung or upper airway disease likely to explain more chronic cough, but suspicion is that this not the primary driver of his current presentation.   #Shortness of Breath #Elevated BNP #Elevated d-dimer -- Will plan to do CTA for PE with and without contrast to evaluate for clot as well as better characterize lung parenchyma given chronic cough.  -- Complete TTE in the  AM -- Will plan to dose another 20mg  IV lasix this AM pending renal function -- LE dopplers if PE study negative.   #HTNsive Urgency / Emergency -- Continue home losartan 100mg  daily -- Will cut home metoprolol to 50mg  XL BID given bradycardia.  -- Suspect will need another agent for BP; can await echo results to determine if GDMT indicated.   #ASCVD Risk #Mild Troponinemia -- Will repeat hsTn here though low suspicion for ACS at this time -- Can look at non-con CT chest for evidence of coronary calcifications -- Will risk stratify with HbA1c and lipid profile -- On ASA 81 mg daily.   Severity of Illness: The appropriate patient status for this patient is INPATIENT. Inpatient status is judged to be reasonable and necessary in order to provide the required intensity of service to ensure the patient's safety. The patient's presenting symptoms, physical exam findings, and initial radiographic and laboratory data in the context of their chronic comorbidities is felt to place them at high risk for further clinical deterioration. Furthermore, it is not anticipated that the patient will be medically stable for discharge from the hospital within 2 midnights of admission. The following factors support the patient status of inpatient.   " The patient's presenting symptoms include dyspnea. " The worrisome physical exam findings include LE edema " The initial radiographic and laboratory data are worrisome because of elevated d-dimer, troponin, bnp " The chronic co-morbidities include obesity, HTN, gout   * I certify that at the point of admission it is my clinical judgment that the patient will require inpatient hospital care spanning beyond 2 midnights from the point of admission due to high intensity of service, high risk for further deterioration and high frequency of surveillance required.*    For questions or updates, please contact [facility] Please consult [email or web address] for contact info  under   Signed, , MD  02/07/2019 4:00 AM

## 2019-02-07 NOTE — Progress Notes (Signed)
PT has arrived from [facility]. Provider on call for Cardiology notified of patients arrival. Jessie Foot, RN

## 2019-02-07 NOTE — Progress Notes (Signed)
  Echocardiogram 2D Echocardiogram has been performed.  Sapphire Tygart A Xzander Gilham 02/07/2019, 10:03 AM

## 2019-02-07 NOTE — Progress Notes (Addendum)
Progress Note  Patient Name: Tyler Macdonald Date of Encounter: 02/07/2019  Primary Cardiologist: No primary care provider on file.   Subjective   Breathing is better this morning. No complaints of pain.   Inpatient Medications    Scheduled Meds: . allopurinol  300 mg Oral Daily  . aspirin EC  81 mg Oral Daily  . heparin  5,000 Units Subcutaneous Q8H  . losartan  100 mg Oral Daily  . metoprolol succinate  50 mg Oral Daily  . sertraline  12.5 mg Oral Daily  . sodium chloride flush  3 mL Intravenous Q12H   Continuous Infusions: . sodium chloride     PRN Meds: sodium chloride, acetaminophen, sodium chloride flush   Vital Signs    Vitals:   02/07/19 0417 02/07/19 0440 02/07/19 0632  BP: (!) 168/65 (!) 192/79   Pulse: (!) 48    Resp: 17    Temp: 98 F (36.7 C)    TempSrc: Oral    SpO2: 97%  98%  Weight: (!) 143 kg    Height: 6\' 7"  (2.007 m)     No intake or output data in the 24 hours ending 02/07/19 0944 Last 3 Weights 02/07/2019  Weight (lbs) 315 lb 3.2 oz  Weight (kg) 142.974 kg      Telemetry    SB rate 40-50 - Personally Reviewed  ECG    SB with 1st degree AVB - Personally Reviewed  Physical Exam  Pleasant older WM, getting echo GEN: No acute distress.   Neck: No JVD Cardiac: RRR, no murmurs, rubs, or gallops.  Respiratory: Clear to auscultation bilaterally. GI: Soft, nontender, non-distended  MS: 1+ bilateral pitting LE edema; No deformity. Neuro:  Nonfocal  Psych: Normal affect   Labs    High Sensitivity Troponin:   Recent Labs  Lab 02/07/19 0533 02/07/19 0708  TROPONINIHS 31* 31*      Chemistry Recent Labs  Lab 02/07/19 0533  NA 142  K 4.2  CL 107  CO2 27  GLUCOSE 106*  BUN 27*  CREATININE 1.67*  CALCIUM 9.1  PROT 6.9  ALBUMIN 3.3*  AST 21  ALT 17  ALKPHOS 114  BILITOT 0.6  GFRNONAA 39*  GFRAA 45*  ANIONGAP 8     Hematology Recent Labs  Lab 02/07/19 0533  WBC 9.9  RBC 3.79*  HGB 12.2*  HCT 37.2*  MCV  98.2  MCH 32.2  MCHC 32.8  RDW 13.1  PLT 301    BNP Recent Labs  Lab 02/07/19 0533  BNP 286.5*     DDimer No results for input(s): DDIMER in the last 168 hours.   Radiology    CT ANGIO CHEST PE W OR WO CONTRAST  Result Date: 02/07/2019 CLINICAL DATA:  78 year old male with history of chronic productive cough for the past 3-4 months. Evaluate for interstitial lung disease. EXAM: CT CHEST WITHOUT CONTRAST CT ANGIOGRAPHY CHEST WITH CONTRAST TECHNIQUE: Multidetector CT imaging of the chest was performed following the standard protocol without intravenous contrast. High resolution imaging of the lungs, as well as inspiratory and expiratory imaging, was performed. Additional multidetector CT imaging of the chest was performed using the standard protocol during bolus administration of intravenous contrast. Multiplanar CT image reconstructions and MIPs were obtained to evaluate the vascular anatomy. COMPARISON:  No priors. FINDINGS: Cardiovascular: No filling defects within the pulmonary arterial tree to suggest pulmonary embolism. Heart size is mildly enlarged. There is no significant pericardial fluid, thickening or pericardial calcification. There is aortic  atherosclerosis, as well as atherosclerosis of the great vessels of the mediastinum and the coronary arteries, including calcified atherosclerotic plaque in the left main, left anterior descending, left circumflex and right coronary arteries. Thickening calcification of the aortic valve. Mediastinum/Nodes: No pathologically enlarged mediastinal or hilar lymph nodes. Esophagus is unremarkable in appearance. No axillary lymphadenopathy. Lungs/Pleura: High-resolution images demonstrate no significant regions of ground-glass attenuation, septal thickening, subpleural reticulation, parenchymal banding, traction bronchiectasis or frank honeycombing. No acute consolidative airspace disease. Trace right pleural effusion lying dependently. No left pleural  effusion. Inspiratory and expiratory imaging is unremarkable. Upper Abdomen: Aortic atherosclerosis. Musculoskeletal: There are no aggressive appearing lytic or blastic lesions noted in the visualized portions of the skeleton. IMPRESSION: 1. No evidence of pulmonary embolism. 2. No findings to suggest interstitial lung disease. 3. Trace right pleural effusion lying dependently. 4. Aortic atherosclerosis, in addition to left main and 3 vessel coronary artery disease. Assessment for potential risk factor modification, dietary therapy or pharmacologic therapy may be warranted, if clinically indicated. 5. There are calcifications of the aortic valve. Echocardiographic correlation for evaluation of potential valvular dysfunction may be warranted if clinically indicated. Aortic Atherosclerosis (ICD10-I70.0). Electronically Signed   By: Trudie Reed M.D.   On: 02/07/2019 09:15   CT Chest High Resolution  Result Date: 02/07/2019 CLINICAL DATA:  78 year old male with history of chronic productive cough for the past 3-4 months. Evaluate for interstitial lung disease. EXAM: CT CHEST WITHOUT CONTRAST CT ANGIOGRAPHY CHEST WITH CONTRAST TECHNIQUE: Multidetector CT imaging of the chest was performed following the standard protocol without intravenous contrast. High resolution imaging of the lungs, as well as inspiratory and expiratory imaging, was performed. Additional multidetector CT imaging of the chest was performed using the standard protocol during bolus administration of intravenous contrast. Multiplanar CT image reconstructions and MIPs were obtained to evaluate the vascular anatomy. COMPARISON:  No priors. FINDINGS: Cardiovascular: No filling defects within the pulmonary arterial tree to suggest pulmonary embolism. Heart size is mildly enlarged. There is no significant pericardial fluid, thickening or pericardial calcification. There is aortic atherosclerosis, as well as atherosclerosis of the great vessels of  the mediastinum and the coronary arteries, including calcified atherosclerotic plaque in the left main, left anterior descending, left circumflex and right coronary arteries. Thickening calcification of the aortic valve. Mediastinum/Nodes: No pathologically enlarged mediastinal or hilar lymph nodes. Esophagus is unremarkable in appearance. No axillary lymphadenopathy. Lungs/Pleura: High-resolution images demonstrate no significant regions of ground-glass attenuation, septal thickening, subpleural reticulation, parenchymal banding, traction bronchiectasis or frank honeycombing. No acute consolidative airspace disease. Trace right pleural effusion lying dependently. No left pleural effusion. Inspiratory and expiratory imaging is unremarkable. Upper Abdomen: Aortic atherosclerosis. Musculoskeletal: There are no aggressive appearing lytic or blastic lesions noted in the visualized portions of the skeleton. IMPRESSION: 1. No evidence of pulmonary embolism. 2. No findings to suggest interstitial lung disease. 3. Trace right pleural effusion lying dependently. 4. Aortic atherosclerosis, in addition to left main and 3 vessel coronary artery disease. Assessment for potential risk factor modification, dietary therapy or pharmacologic therapy may be warranted, if clinically indicated. 5. There are calcifications of the aortic valve. Echocardiographic correlation for evaluation of potential valvular dysfunction may be warranted if clinically indicated. Aortic Atherosclerosis (ICD10-I70.0). Electronically Signed   By: Trudie Reed M.D.   On: 02/07/2019 09:15    Cardiac Studies   TTE: pending  Patient Profile     78 y.o. male with PMH of HTN and CVA who presented to [facility] with shortness of  breath, dizziness and hypertension. Transferred to [facility] for further management.   Assessment & Plan    1. Dyspnea: reports sudden onset Sunday morning that woke him from sleep. No chest pain. Ddimer 1.84, CTA  negative for PE but does show some coronary calcifications. Given IV lasix this morning, but no UOP documented thus far. Follow response. Says he was on a diuretic in the past but stopped because he was getting up to urinate most of the night. HsT 31 x2.  -- echo pending -- LE Dopplers pending  2. HTN: reports compliance with his home medications. Pressures normally run in the 846N systolic. While at [facility] BP was > 629 systolic. BB was reduced to 50mg  daily given bradycardia.  -- add norvasc 5mg  today  3. HL: denies having been on statin in the past. LDL 146. Will add crestor 20mg  daily  For questions or updates, please contact [place] Please consult [email or web address] for contact info under        Signed, Reino Bellis, NP  02/07/2019, 9:44 AM    ATTENDING ATTESTATION  I have seen, examined and evaluated the patient this AM along with Reino Bellis, NP-C.  After reviewing all the available data and chart, we discussed the patients laboratory, study & physical findings as well as symptoms in detail. I agree with her findings, examination as well as impression recommendations as per our discussion.    Seen briefly this morning.  Was admitted just several hours ago.  Has now had a CT scan that does not show any evidence of PE.  There is evidence of coronary artery calcification.  My impression is that his presentation is consistent with accelerated hypertension with heart failure--2D echocardiogram pending to determine if this is simply diastolic heart failure versus combined heart failure.  My suspicion is that with accelerated hypertension he had diastolic heart failure leading to flash pulmonary edema and led to his dyspnea.  For now the plan will be to titrate his antihypertensive agents and follow the results of echocardiogram.  Based on echo results, we can then determine whether or not we need to do ischemic evaluation or not since there was coronary calcification noted.  I  reviewed the CT scan there is calcification but not extensive.  Will allow for contrast washout today.  With concern for possible left main coronary disease, I think probably the best evaluation would be coronary CTA.  Can discuss in the morning.Glenetta Hew, M.D., M.S. Interventional Cardiologist   Pager # [phone] Phone # [phone] [address] [place], [place] [address]

## 2019-02-08 ENCOUNTER — Inpatient Hospital Stay (HOSPITAL_COMMUNITY): Payer: Medicare Other

## 2019-02-08 DIAGNOSIS — I2510 Atherosclerotic heart disease of native coronary artery without angina pectoris: Secondary | ICD-10-CM

## 2019-02-08 DIAGNOSIS — I110 Hypertensive heart disease with heart failure: Secondary | ICD-10-CM | POA: Diagnosis present

## 2019-02-08 LAB — BASIC METABOLIC PANEL
Anion gap: 10 (ref 5–15)
BUN: 29 mg/dL — ABNORMAL HIGH (ref 8–23)
CO2: 24 mmol/L (ref 22–32)
Calcium: 8.8 mg/dL — ABNORMAL LOW (ref 8.9–10.3)
Chloride: 105 mmol/L (ref 98–111)
Creatinine, Ser: 1.37 mg/dL — ABNORMAL HIGH (ref 0.61–1.24)
GFR calc Af Amer: 58 mL/min — ABNORMAL LOW (ref >60)
GFR calc non Af Amer: 50 mL/min — ABNORMAL LOW (ref >60)
Glucose, Bld: 87 mg/dL (ref 70–99)
Potassium: 3.9 mmol/L (ref 3.5–5.1)
Sodium: 139 mmol/L (ref 135–145)

## 2019-02-08 MED ORDER — NITROGLYCERIN 0.4 MG SL SUBL
SUBLINGUAL_TABLET | SUBLINGUAL | Status: AC
  Administered 2019-02-08: 0.8 mg via SUBLINGUAL
  Filled 2019-02-08: qty 2

## 2019-02-08 MED ORDER — NITROGLYCERIN 0.4 MG SL SUBL: 0.8000 mg | SUBLINGUAL_TABLET | Freq: Once | SUBLINGUAL | Status: AC

## 2019-02-08 MED ORDER — AMLODIPINE BESYLATE 10 MG PO TABS
10.0000 mg | ORAL_TABLET | Freq: Every day | ORAL | Status: DC
  Administered 2019-02-08 – 2019-02-09 (×2): 10 mg via ORAL
  Filled 2019-02-08 (×2): qty 1

## 2019-02-08 MED ORDER — ROSUVASTATIN CALCIUM 20 MG PO TABS
40.0000 mg | ORAL_TABLET | Freq: Every day | ORAL | Status: DC
  Administered 2019-02-08: 40 mg via ORAL
  Filled 2019-02-08: qty 2

## 2019-02-08 MED ORDER — IOHEXOL 350 MG/ML SOLN
100.0000 mL | Freq: Once | INTRAVENOUS | Status: AC | PRN
  Administered 2019-02-08: 100 mL via INTRAVENOUS

## 2019-02-08 MED ORDER — METOPROLOL SUCCINATE ER 25 MG PO TB24
25.0000 mg | ORAL_TABLET | Freq: Every day | ORAL | Status: DC
  Administered 2019-02-08 – 2019-02-09 (×2): 25 mg via ORAL
  Filled 2019-02-08 (×2): qty 1

## 2019-02-08 NOTE — Progress Notes (Signed)
Progress Note  Patient Name: Tyler Macdonald Date of Encounter: 02/08/2019  Primary Cardiologist: Bryan Lemma, MD  Subjective   Feeling well this morning. Breathing has improved.   Inpatient Medications    Scheduled Meds:  allopurinol  300 mg Oral Daily   amLODipine  5 mg Oral Daily   aspirin EC  81 mg Oral Daily   heparin  5,000 Units Subcutaneous Q8H   losartan  100 mg Oral Daily   metoprolol succinate  50 mg Oral Daily   rosuvastatin  20 mg Oral q1800   sertraline  12.5 mg Oral Daily   sodium chloride flush  3 mL Intravenous Q12H   Continuous Infusions:  sodium chloride     PRN Meds: sodium chloride, acetaminophen, sodium chloride flush   Vital Signs    Vitals:   02/07/19 1400 02/07/19 2029 02/07/19 2054 02/08/19 0530  BP: (!) 172/69 (!) 183/64 (!) 148/40 (!) 149/52  Pulse: (!) 46 (!) 45  (!) 53  Resp: Temp: 98 F (36.7 C) 97.7 F (36.5 C)  97.6 F (36.4 C)  TempSrc: Oral Oral  Oral  SpO2:  92%  99%  Weight:    (!) 140.5 kg  Height:        Intake/Output Summary (Last 24 hours) at 02/08/2019 0955 Last data filed at 02/08/2019 0316 Gross per 24 hour  Intake 363 ml  Output 950 ml  Net -587 ml   Last 3 Weights 02/08/2019 02/07/2019  Weight (lbs) 309 lb 12.8 oz 315 lb 3.2 oz  Weight (kg) 140.524 kg 142.974 kg      Telemetry    SB - Personally Reviewed  ECG    No new tracing this morning.  Physical Exam  Pleasant older WM GEN: No acute distress.   Neck: No JVD Cardiac: RRR, no murmurs, rubs, or gallops.  Respiratory: Clear to auscultation bilaterally. GI: Soft, nontender, non-distended  MS: No edema; No deformity. Neuro:  Nonfocal  Psych: Normal affect   Labs    High Sensitivity Troponin:   Recent Labs  Lab 02/07/19 0533 02/07/19 0708  TROPONINIHS 31* 31*      Chemistry Recent Labs  Lab 02/07/19 0533 02/08/19 0403  NA 142 139  K 4.2 3.9  CL 107 105  CO2 27 24  GLUCOSE 106* 87  BUN 27* 29*    CREATININE 1.67* 1.37*  CALCIUM 9.1 8.8*  PROT 6.9  --   ALBUMIN 3.3*  --   AST 21  --   ALT 17  --   ALKPHOS 114  --   BILITOT 0.6  --   GFRNONAA 39* 50*  GFRAA 45* 58*  ANIONGAP 8 10     Hematology Recent Labs  Lab 02/07/19 0533  WBC 9.9  RBC 3.79*  HGB 12.2*  HCT 37.2*  MCV 98.2  MCH 32.2  MCHC 32.8  RDW 13.1  PLT 301    BNP Recent Labs  Lab 02/07/19 0533  BNP 286.5*     DDimer No results for input(s): DDIMER in the last 168 hours.   Radiology    CT ANGIO CHEST PE W OR WO CONTRAST  Result Date: 02/07/2019 CLINICAL DATA:  78 year old male with history of chronic productive cough for the past 3-4 months. Evaluate for interstitial lung disease. EXAM: CT CHEST WITHOUT CONTRAST CT ANGIOGRAPHY CHEST WITH CONTRAST TECHNIQUE: Multidetector CT imaging of the chest was performed following the standard protocol without intravenous contrast. High resolution imaging of the lungs,  as well as inspiratory and expiratory imaging, was performed. Additional multidetector CT imaging of the chest was performed using the standard protocol during bolus administration of intravenous contrast. Multiplanar CT image reconstructions and MIPs were obtained to evaluate the vascular anatomy. COMPARISON:  No priors. FINDINGS: Cardiovascular: No filling defects within the pulmonary arterial tree to suggest pulmonary embolism. Heart size is mildly enlarged. There is no significant pericardial fluid, thickening or pericardial calcification. There is aortic atherosclerosis, as well as atherosclerosis of the great vessels of the mediastinum and the coronary arteries, including calcified atherosclerotic plaque in the left main, left anterior descending, left circumflex and right coronary arteries. Thickening calcification of the aortic valve. Mediastinum/Nodes: No pathologically enlarged mediastinal or hilar lymph nodes. Esophagus is unremarkable in appearance. No axillary lymphadenopathy. Lungs/Pleura:  High-resolution images demonstrate no significant regions of ground-glass attenuation, septal thickening, subpleural reticulation, parenchymal banding, traction bronchiectasis or frank honeycombing. No acute consolidative airspace disease. Trace right pleural effusion lying dependently. No left pleural effusion. Inspiratory and expiratory imaging is unremarkable. Upper Abdomen: Aortic atherosclerosis. Musculoskeletal: There are no aggressive appearing lytic or blastic lesions noted in the visualized portions of the skeleton. IMPRESSION: 1. No evidence of pulmonary embolism. 2. No findings to suggest interstitial lung disease. 3. Trace right pleural effusion lying dependently. 4. Aortic atherosclerosis, in addition to left main and 3 vessel coronary artery disease. Assessment for potential risk factor modification, dietary therapy or pharmacologic therapy may be warranted, if clinically indicated. 5. There are calcifications of the aortic valve. Echocardiographic correlation for evaluation of potential valvular dysfunction may be warranted if clinically indicated. Aortic Atherosclerosis (ICD10-I70.0). Electronically Signed   By: Trudie Reed M.D.   On: 02/07/2019 09:15   CT Chest High Resolution  Result Date: 02/07/2019 CLINICAL DATA:  78 year old male with history of chronic productive cough for the past 3-4 months. Evaluate for interstitial lung disease. EXAM: CT CHEST WITHOUT CONTRAST CT ANGIOGRAPHY CHEST WITH CONTRAST TECHNIQUE: Multidetector CT imaging of the chest was performed following the standard protocol without intravenous contrast. High resolution imaging of the lungs, as well as inspiratory and expiratory imaging, was performed. Additional multidetector CT imaging of the chest was performed using the standard protocol during bolus administration of intravenous contrast. Multiplanar CT image reconstructions and MIPs were obtained to evaluate the vascular anatomy. COMPARISON:  No priors.  FINDINGS: Cardiovascular: No filling defects within the pulmonary arterial tree to suggest pulmonary embolism. Heart size is mildly enlarged. There is no significant pericardial fluid, thickening or pericardial calcification. There is aortic atherosclerosis, as well as atherosclerosis of the great vessels of the mediastinum and the coronary arteries, including calcified atherosclerotic plaque in the left main, left anterior descending, left circumflex and right coronary arteries. Thickening calcification of the aortic valve. Mediastinum/Nodes: No pathologically enlarged mediastinal or hilar lymph nodes. Esophagus is unremarkable in appearance. No axillary lymphadenopathy. Lungs/Pleura: High-resolution images demonstrate no significant regions of ground-glass attenuation, septal thickening, subpleural reticulation, parenchymal banding, traction bronchiectasis or frank honeycombing. No acute consolidative airspace disease. Trace right pleural effusion lying dependently. No left pleural effusion. Inspiratory and expiratory imaging is unremarkable. Upper Abdomen: Aortic atherosclerosis. Musculoskeletal: There are no aggressive appearing lytic or blastic lesions noted in the visualized portions of the skeleton. IMPRESSION: 1. No evidence of pulmonary embolism. 2. No findings to suggest interstitial lung disease. 3. Trace right pleural effusion lying dependently. 4. Aortic atherosclerosis, in addition to left main and 3 vessel coronary artery disease. Assessment for potential risk factor modification, dietary therapy or pharmacologic therapy may be  warranted, if clinically indicated. 5. There are calcifications of the aortic valve. Echocardiographic correlation for evaluation of potential valvular dysfunction may be warranted if clinically indicated. Aortic Atherosclerosis (ICD10-I70.0). Electronically Signed   By: Trudie Reedaniel  Entrikin M.D.   On: 02/07/2019 09:15   ECHOCARDIOGRAM COMPLETE  Result Date: 02/07/2019    ECHOCARDIOGRAM REPORT   Patient Name:   Monico BlitzJAMES R Aicher Date of Exam: 02/07/2019 Medical Rec #:  [id number]        Height:       79.0 in Accession #:    [id number]       Weight:       315.2 lb Date of Birth:  01/06/1941         BSA:          2.77 m Patient Age:    78 years         BP:           192/79 mmHg Patient Gender: M                HR:           48 bpm. Exam Location:  Inpatient Procedure: 2D Echo Indications:    Dyspnea 786.09 / R06.00  History:        Patient has no prior history of Echocardiogram examinations.                 Signs/Symptoms:Shortness of Breath; Risk Factors:Hypertension.  Sonographer:    Leeroy Bockhelsea Turrentine Referring Phys: [id number] Laurell JosephsLAUREN K TRUBY IMPRESSIONS  1. Left ventricular ejection fraction, by visual estimation, is 55 to 60%. The left ventricle has normal function. Left ventricular septal wall thickness was mildly increased. Mildly increased left ventricular posterior wall thickness. There is mildly increased left ventricular hypertrophy.  2. Left ventricular diastolic parameters are consistent with Grade II diastolic dysfunction (pseudonormalization).  3. Mildly dilated left ventricular internal cavity size.  4. The left ventricle has no regional wall motion abnormalities.  5. Elevated LVEDP.  6. Global right ventricle has normal systolic function.The right ventricular size is normal. No increase in right ventricular wall thickness.  7. Left atrial size was moderately dilated.  8. Right atrial size was normal.  9. The mitral valve is normal in structure. Trivial mitral valve regurgitation. No evidence of mitral stenosis. 10. The tricuspid valve is normal in structure. 11. Aortic valve regurgitation is severe. 12. The aortic valve is tricuspid. Aortic valve regurgitation is severe. Mild aortic valve stenosis. 13. Pulmonic regurgitation is mild. 14. The pulmonic valve was normal in structure. Pulmonic valve regurgitation is mild. 15. Mildly elevated pulmonary artery systolic pressure.  FINDINGS  Left Ventricle: Left ventricular ejection fraction, by visual estimation, is 55 to 60%. The left ventricle has normal function. The left ventricle has no regional wall motion abnormalities. The left ventricular internal cavity size was mildly dilated left ventricle. Mildly increased left ventricular posterior wall thickness. There is mildly increased left ventricular hypertrophy. Left ventricular diastolic parameters are consistent with Grade II diastolic dysfunction (pseudonormalization). Normal left atrial pressure. Elevated LVEDP. Right Ventricle: The right ventricular size is normal. No increase in right ventricular wall thickness. Global RV systolic function is has normal systolic function. The tricuspid regurgitant velocity is 2.59 m/s, and with an assumed right atrial pressure  of 8 mmHg, the estimated right ventricular systolic pressure is mildly elevated at 34.8 mmHg. Left Atrium: Left atrial size was moderately dilated. Right Atrium: Right atrial size was normal in size Pericardium: There is no evidence of  pericardial effusion. Mitral Valve: The mitral valve is normal in structure. Trivial mitral valve regurgitation. No evidence of mitral valve stenosis by observation. Tricuspid Valve: The tricuspid valve is normal in structure. Tricuspid valve regurgitation is trivial. Aortic Valve: The aortic valve is tricuspid. . There is mild thickening and mild calcification of the aortic valve. Aortic valve regurgitation is severe. Mild aortic stenosis is present. There is mild thickening of the aortic valve. There is mild calcification of the aortic valve. Aortic valve mean gradient measures 10.0 mmHg. Aortic valve peak gradient measures 17.8 mmHg. Aortic valve area, by VTI measures 1.78 cm. Pulmonic Valve: The pulmonic valve was normal in structure. Pulmonic valve regurgitation is mild. Pulmonic regurgitation is mild. Aorta: The aortic root, ascending aorta and aortic arch are all structurally normal,  with no evidence of dilitation or obstruction. IAS/Shunts: No atrial level shunt detected by color flow Doppler. There is no evidence of a patent foramen ovale. No ventricular septal defect is seen or detected. There is no evidence of an atrial septal defect.  LEFT VENTRICLE PLAX 2D LVIDd:         5.73 cm  Diastology LVIDs:         3.88 cm  LV e' lateral:   11.30 cm/s LV PW:         1.22 cm  LV E/e' lateral: 9.0 LV IVS:        1.20 cm  LV e' medial:    5.87 cm/s LVOT diam:     2.00 cm  LV E/e' medial:  17.4 LV SV:         97 ml LV SV Index:   33.84 LVOT Area:     3.14 cm  RIGHT VENTRICLE RV S prime:     15.40 cm/s TAPSE (M-mode): 2.8 cm LEFT ATRIUM              Index       RIGHT ATRIUM           Index LA diam:        5.90 cm  2.13 cm/m  RA Area:     23.70 cm LA Vol (A2C):   102.0 ml 36.89 ml/m RA Volume:   66.60 ml  24.09 ml/m LA Vol (A4C):   91.0 ml  32.91 ml/m LA Biplane Vol: 101.0 ml 36.53 ml/m  AORTIC VALVE AV Area (Vmax):    1.74 cm AV Area (Vmean):   1.57 cm AV Area (VTI):     1.78 cm AV Vmax:           211.00 cm/s AV Vmean:          149.000 cm/s AV VTI:            0.549 m AV Peak Grad:      17.8 mmHg AV Mean Grad:      10.0 mmHg LVOT Vmax:         117.00 cm/s LVOT Vmean:        74.300 cm/s LVOT VTI:          0.311 m LVOT/AV VTI ratio: 0.57  AORTA Ao Root diam: 3.20 cm MITRAL VALVE                         TRICUSPID VALVE MV Area (PHT): 2.32 cm              TR Peak grad:   26.8 mmHg MV PHT:  94.83 msec            TR Vmax:        261.00 cm/s MV Decel Time: 327 msec MV E velocity: 102.00 cm/s 103 cm/s  SHUNTS MV A velocity: 57.80 cm/s  70.3 cm/s Systemic VTI:  0.31 m MV E/A ratio:  1.76        1.5       Systemic Diam: 2.00 cm  Chilton Si MD Electronically signed by Chilton Si MD Signature Date/Time: 02/07/2019/2:23:08 PM    Final    VAS [place] LOWER EXTREMITY VENOUS (DVT)  Result Date: 02/07/2019  Lower Venous Study Indications: Swelling.  Risk Factors: None identified. Limitations:  Poor ultrasound/tissue interface. Comparison Study: No prior studies. Performing Technologist: Chanda Busing RVT  Examination Guidelines: A complete evaluation includes B-mode imaging, spectral Doppler, color Doppler, and power Doppler as needed of all accessible portions of each vessel. Bilateral testing is considered an integral part of a complete examination. Limited examinations for reoccurring indications may be performed as noted.  +---------+---------------+---------+-----------+----------+--------------+  RIGHT     Compressibility Phasicity Spontaneity Properties Thrombus Aging  +---------+---------------+---------+-----------+----------+--------------+  CFV       Full            Yes       Yes                                    +---------+---------------+---------+-----------+----------+--------------+  SFJ       Full                                                             +---------+---------------+---------+-----------+----------+--------------+  FV Prox   Full                                                             +---------+---------------+---------+-----------+----------+--------------+  FV Mid    Full                                                             +---------+---------------+---------+-----------+----------+--------------+  FV Distal Full                                                             +---------+---------------+---------+-----------+----------+--------------+  PFV       Full                                                             +---------+---------------+---------+-----------+----------+--------------+  POP  Full            Yes       Yes                                    +---------+---------------+---------+-----------+----------+--------------+  PTV       Full                                                             +---------+---------------+---------+-----------+----------+--------------+  PERO      Full                                                              +---------+---------------+---------+-----------+----------+--------------+   +---------+---------------+---------+-----------+----------+--------------+  LEFT      Compressibility Phasicity Spontaneity Properties Thrombus Aging  +---------+---------------+---------+-----------+----------+--------------+  CFV       Full            Yes       Yes                                    +---------+---------------+---------+-----------+----------+--------------+  SFJ       Full                                                             +---------+---------------+---------+-----------+----------+--------------+  FV Prox   Full                                                             +---------+---------------+---------+-----------+----------+--------------+  FV Mid    Full                                                             +---------+---------------+---------+-----------+----------+--------------+  FV Distal Full                                                             +---------+---------------+---------+-----------+----------+--------------+  PFV       Full                                                             +---------+---------------+---------+-----------+----------+--------------+  POP       Full            Yes       Yes                                    +---------+---------------+---------+-----------+----------+--------------+  PTV       Full                                                             +---------+---------------+---------+-----------+----------+--------------+  PERO      Full                                                             +---------+---------------+---------+-----------+----------+--------------+     Summary: Right: There is no evidence of deep vein thrombosis in the lower extremity. No cystic structure found in the popliteal fossa. Left: There is no evidence of deep vein thrombosis in the lower extremity. No cystic structure found in the popliteal  fossa.  *See table(s) above for measurements and observations. Electronically signed by Servando Snare MD on 02/07/2019 at 3:46:48 PM.    Final     Cardiac Studies   TTE: 02/07/19  IMPRESSIONS    1. Left ventricular ejection fraction, by visual estimation, is 55 to 60%. The left ventricle has normal function. Left ventricular septal wall thickness was mildly increased. Mildly increased left ventricular posterior wall thickness. There is mildly  increased left ventricular hypertrophy.  2. Left ventricular diastolic parameters are consistent with Grade II diastolic dysfunction (pseudonormalization).  3. Mildly dilated left ventricular internal cavity size.  4. The left ventricle has no regional wall motion abnormalities.  5. Elevated LVEDP.  6. Global right ventricle has normal systolic function.The right ventricular size is normal. No increase in right ventricular wall thickness.  7. Left atrial size was moderately dilated.  8. Right atrial size was normal.  9. The mitral valve is normal in structure. Trivial mitral valve regurgitation. No evidence of mitral stenosis. 10. The tricuspid valve is normal in structure. 11. Aortic valve regurgitation is severe. 12. The aortic valve is tricuspid. Aortic valve regurgitation is severe. Mild aortic valve stenosis. 13. Pulmonic regurgitation is mild. 14. The pulmonic valve was normal in structure. Pulmonic valve regurgitation is mild. 15. Mildly elevated pulmonary artery systolic pressure.  Patient Profile     78 y.o. male with PMH of HTN and CVA who presented to [facility] with shortness of breath, dizziness and hypertension. Transferred to [facility] for further management.   Assessment & Plan    1. Dyspnea: reports sudden onset Sunday morning that woke him from sleep. No chest pain. Ddimer 1.84, CTA negative for PE but does show some coronary calcifications. LE dopplers were negative. Breathing is stable this morning. Echo showed  normal EF with G2DD -- given calcifications noted on CT will plan for follow up coronary CTA today.  2. HTN: reports compliance with his home medications. Pressures normally run in the 500X systolic. While at [facility] BP was > 381 systolic. Still  bradycardiac this morning. Will further reduce Toprol to  daily. Increase norvasc to  daily. -- hold losartan with plans for coronary CTA.  3. HL: denies having been on statin in the past. LDL 146. Now on high dose statin.   4. Mild AS: noted on echo, severe aortic regurg.   For questions or updates, please contact [facility] Please consult [email or web address] for contact info under    Signed, Laverda Page, NP  02/08/2019, 9:55 AM

## 2019-02-08 NOTE — Progress Notes (Signed)
Pt slept mostly throughout the night but was easily arousal. HR in the 30-50s when he sleeps. Denies pain or shortness of breath at this time.

## 2019-02-09 DIAGNOSIS — I10 Essential (primary) hypertension: Secondary | ICD-10-CM

## 2019-02-09 DIAGNOSIS — E785 Hyperlipidemia, unspecified: Secondary | ICD-10-CM

## 2019-02-09 LAB — BASIC METABOLIC PANEL
Anion gap: 7 (ref 5–15)
BUN: 22 mg/dL (ref 8–23)
CO2: 27 mmol/L (ref 22–32)
Calcium: 8.6 mg/dL — ABNORMAL LOW (ref 8.9–10.3)
Chloride: 105 mmol/L (ref 98–111)
Creatinine, Ser: 1.3 mg/dL — ABNORMAL HIGH (ref 0.61–1.24)
GFR calc Af Amer: >60 mL/min (ref >60)
GFR calc non Af Amer: 53 mL/min — ABNORMAL LOW (ref >60)
Glucose, Bld: 98 mg/dL (ref 70–99)
Potassium: 4.2 mmol/L (ref 3.5–5.1)
Sodium: 139 mmol/L (ref 135–145)

## 2019-02-09 MED ORDER — METOPROLOL SUCCINATE ER 25 MG PO TB24: 25.0000 mg | ORAL_TABLET | Freq: Every day | ORAL | 0 refills | Status: AC

## 2019-02-09 MED ORDER — AMLODIPINE BESYLATE 10 MG PO TABS: 10.0000 mg | ORAL_TABLET | Freq: Every day | ORAL | 0 refills | Status: DC

## 2019-02-09 MED ORDER — HYDROCHLOROTHIAZIDE 12.5 MG PO CAPS: 12.5000 mg | ORAL_CAPSULE | Freq: Every day | ORAL | 0 refills | Status: DC

## 2019-02-09 MED ORDER — ROSUVASTATIN CALCIUM 40 MG PO TABS: 40.0000 mg | ORAL_TABLET | Freq: Every day | ORAL | 0 refills | Status: AC

## 2019-02-09 MED FILL — METOPROLOL SUCCINATE ER 25: 25 | 30 days supply | Qty: 30 | Fill #0

## 2019-02-09 MED FILL — ROSUVASTATIN CALCIUM 40 MG: 40 | 30 days supply | Qty: 30 | Fill #0

## 2019-02-09 MED FILL — HYDROCHLOROTHIAZIDE 12.5 MG: 12.5 | 30 days supply | Qty: 30 | Fill #0

## 2019-02-09 MED FILL — AMLODIPINE BESYLATE 10 MG T: 10 | 30 days supply | Qty: 30 | Fill #0

## 2019-02-09 NOTE — Discharge Summary (Addendum)
Discharge Summary    Patient ID: LEOMAR WESTBERG,  MRN: [id number], DOB/AGE: 09/01/1940 78 y.o.  Admit date: 02/07/2019 Discharge date: 02/09/2019  Primary Care Provider: Selinda Flavin Primary Cardiologist: Bryan Lemma, MD  Discharge Diagnoses    Principal Problem:   Hypertension, accelerated, with diastolic congestive heart failure, NYHA class 1 (HCC) Active Problems:   Essential hypertension   Cough with sputum   Gout   Shortness of breath   Coronary artery calcification seen on CAT scan   Allergies Allergies  Allergen Reactions  . Cephalexin   . Penicillin G Hives    Did it involve swelling of the face/tongue/throat, SOB, or low BP? No Did it involve sudden or severe rash/hives, skin peeling, or any reaction on the inside of your mouth or nose? Yes Did you need to seek medical attention at a hospital or doctor's office? Yes When did it last happen? If all above answers are "NO", may proceed with cephalosporin use.    Diagnostic Studies/Procedures    TTE: 02/07/19  IMPRESSIONS    1. Left ventricular ejection fraction, by visual estimation, is 55 to 60%. The left ventricle has normal function. Left ventricular septal wall thickness was mildly increased. Mildly increased left ventricular posterior wall thickness. There is mildly  increased left ventricular hypertrophy.  2. Left ventricular diastolic parameters are consistent with Grade II diastolic dysfunction (pseudonormalization).  3. Mildly dilated left ventricular internal cavity size.  4. The left ventricle has no regional wall motion abnormalities.  5. Elevated LVEDP.  6. Global right ventricle has normal systolic function.The right ventricular size is normal. No increase in right ventricular wall thickness.  7. Left atrial size was moderately dilated.  8. Right atrial size was normal.  9. The mitral valve is normal in structure. Trivial mitral valve regurgitation. No evidence of mitral  stenosis. 10. The tricuspid valve is normal in structure. 11. Aortic valve regurgitation is severe. 12. The aortic valve is tricuspid. Aortic valve regurgitation is severe. Mild aortic valve stenosis. 13. Pulmonic regurgitation is mild. 14. The pulmonic valve was normal in structure. Pulmonic valve regurgitation is mild. 15. Mildly elevated pulmonary artery systolic pressure. _____________   History of Present Illness     Mr. Hubbert is a 78 yo male who was in his usual state of health - ambulatory, but frequently relying on a motorized scooter due to bilateral knee OA, until the Sunday morning of admission. At that point, he awoke with significant shortness of breath feeling like he was "suffocating" he drove himself to [facility] where he was found to be HTNsive to 200s/80s though not hypoxic. In the ED there, labs were remarkable for an elevated d-dimer and a troponin of 0.04 - 0.05. He was treated with  IV lasix, IV hydralazine, and nitro paste for HTNsive urgency. Because of the d-dimer, a CTA was ordered but could not be performed, and he was empirically given 1 dose of therapeutic lovenox.   Of note, patient was previously on HCTZ, but this was discontinued 2 weeks ago due to nocturia.   On review of system, patient without fevers, or chills. He has developed a cough productive of white phlegm over the past 3-4 months. He had some mild CP on the day of his presentation but no anginal symptoms previously. No orthopnea. Did notice that he has had some worsening bilateral ankle edema and that week has noted that the R > L. Case was discussed with on call MD and he was  [facility].   Hospital Course     1. Dyspnea: Ddimer 1.84, CTA negative for PE but did show some coronary calcifications. LE dopplers were negative. Echo showed normal EF with G2DD. With CT findings he underwent coronary CTA with FFR which was negative for significant stenosis but does  have moderate stenosis in the LAD and RCA. Suspect his episode of dyspnea was 2/2 to severe hypertension.  -- on ASA, statin, BB, ARB  2. XBJ:YNWGNFAOHTN:reported compliance with his home medications. Pressures normally run in the 130s systolic. [facility]. He was bradycardiac on admission and home Toprol was reduced from 75 mg to 25mg  daily. Added norvasc 10mg  daily and resumed home losartan 100mg  daily after CT scans. Also added HCTZ 12.5 mg daily, he had stopped this several weeks prior fue to nocturia. Instructed to take in the morning instead to help with this. -- may asked that he up date his home blood pressure cuff as he feels he may have been getting inaccurate readings.   3. HL: denied having been on statin in the past. LDL 146. Now on high dose statin.   4. Mild AS: noted on echo, severe aortic regurg. Suspect could be affected by his elevated blood pressures. Recommend rechecking echo in 3 months.   General: Well developed, well nourished, male appearing in no acute distress. Head: Normocephalic, atraumatic.  Neck: Supple without bruits, JVD. Lungs:  Resp regular and unlabored, CTA. Heart: RRR, S1, S2, no S3, S4, or murmur; no rub. Abdomen: Soft, non-tender, non-distended with normoactive bowel sounds. No hepatomegaly. No rebound/guarding. No obvious abdominal masses. Extremities: No clubbing, cyanosis, edema. Distal pedal pulses are 2+ bilaterally. Neuro: Alert and oriented X 3. Moves all extremities spontaneously. Psych: Normal affect.  Monico BlitzJames R Bendavid was seen by Dr. Herbie [facility] and determined stable for discharge home. Follow up in the office has been arranged. Medications are listed below.   _____________  Discharge Vitals Blood pressure (!) 179/61, pulse (!) 55, temperature (!) 97.5 F (36.4 C), temperature source Oral, resp. rate 20, height 6\' 7"  (2.007 m), weight (!) 139.3 kg, SpO2 95 %.  Filed Weights   02/07/19 0417 02/08/19 0530 02/09/19 0630  Weight:  (!) 143 kg (!) 140.5 kg (!) 139.3 kg    Labs & Radiologic Studies    CBC Recent Labs    02/07/19 0533  WBC 9.9  NEUTROABS 7.9*  HGB 12.2*  HCT 37.2*  MCV 98.2  PLT 301   Basic Metabolic Panel Recent Labs    13/09/6510/29/20 0533 02/08/19 0403 02/09/19 0358  NA 142 139 139  K 4.2 3.9 4.2  CL 107 105 105  CO2 27 24 27   GLUCOSE 106* 87 98  BUN 27* 29* 22  CREATININE 1.67* 1.37* 1.30*  CALCIUM 9.1 8.8* 8.6*  MG 1.8  --   --    Liver Function Tests Recent Labs    02/07/19 0533  AST 21  ALT 17  ALKPHOS 114  BILITOT 0.6  PROT 6.9  ALBUMIN 3.3*   No results for input(s): LIPASE, AMYLASE in the last 72 hours. Cardiac Enzymes No results for input(s): CKTOTAL, CKMB, CKMBINDEX, TROPONINI in the last 72 hours. BNP Invalid input(s): POCBNP D-Dimer No results for input(s): DDIMER in the last 72 hours. Hemoglobin A1C Recent Labs    02/07/19 0533  HGBA1C 5.4   Fasting Lipid Panel Recent Labs    02/07/19 0533  CHOL 196  HDL 38*  LDLCALC 146*  TRIG 59  CHOLHDL 5.2   Thyroid Function Tests Recent Labs    02/07/19 0533  TSH 0.636   _____________  CT ANGIO CHEST PE W OR WO CONTRAST  Result Date: 02/07/2019 CLINICAL DATA:  78 year old male with history of chronic productive cough for the past 3-4 months. Evaluate for interstitial lung disease. EXAM: CT CHEST WITHOUT CONTRAST CT ANGIOGRAPHY CHEST WITH CONTRAST TECHNIQUE: Multidetector CT imaging of the chest was performed following the standard protocol without intravenous contrast. High resolution imaging of the lungs, as well as inspiratory and expiratory imaging, was performed. Additional multidetector CT imaging of the chest was performed using the standard protocol during bolus administration of intravenous contrast. Multiplanar CT image reconstructions and MIPs were obtained to evaluate the vascular anatomy. COMPARISON:  No priors. FINDINGS: Cardiovascular: No filling defects within the pulmonary arterial tree to  suggest pulmonary embolism. Heart size is mildly enlarged. There is no significant pericardial fluid, thickening or pericardial calcification. There is aortic atherosclerosis, as well as atherosclerosis of the great vessels of the mediastinum and the coronary arteries, including calcified atherosclerotic plaque in the left main, left anterior descending, left circumflex and right coronary arteries. Thickening calcification of the aortic valve. Mediastinum/Nodes: No pathologically enlarged mediastinal or hilar lymph nodes. Esophagus is unremarkable in appearance. No axillary lymphadenopathy. Lungs/Pleura: High-resolution images demonstrate no significant regions of ground-glass attenuation, septal thickening, subpleural reticulation, parenchymal banding, traction bronchiectasis or frank honeycombing. No acute consolidative airspace disease. Trace right pleural effusion lying dependently. No left pleural effusion. Inspiratory and expiratory imaging is unremarkable. Upper Abdomen: Aortic atherosclerosis. Musculoskeletal: There are no aggressive appearing lytic or blastic lesions noted in the visualized portions of the skeleton. IMPRESSION: 1. No evidence of pulmonary embolism. 2. No findings to suggest interstitial lung disease. 3. Trace right pleural effusion lying dependently. 4. Aortic atherosclerosis, in addition to left main and 3 vessel coronary artery disease. Assessment for potential risk factor modification, dietary therapy or pharmacologic therapy may be warranted, if clinically indicated. 5. There are calcifications of the aortic valve. Echocardiographic correlation for evaluation of potential valvular dysfunction may be warranted if clinically indicated. Aortic Atherosclerosis (ICD10-I70.0). Electronically Signed   By: Trudie Reed M.D.   On: 02/07/2019 09:15   CT Chest High Resolution  Result Date: 02/07/2019 CLINICAL DATA:  77 year old male with history of chronic productive cough for the past  3-4 months. Evaluate for interstitial lung disease. EXAM: CT CHEST WITHOUT CONTRAST CT ANGIOGRAPHY CHEST WITH CONTRAST TECHNIQUE: Multidetector CT imaging of the chest was performed following the standard protocol without intravenous contrast. High resolution imaging of the lungs, as well as inspiratory and expiratory imaging, was performed. Additional multidetector CT imaging of the chest was performed using the standard protocol during bolus administration of intravenous contrast. Multiplanar CT image reconstructions and MIPs were obtained to evaluate the vascular anatomy. COMPARISON:  No priors. FINDINGS: Cardiovascular: No filling defects within the pulmonary arterial tree to suggest pulmonary embolism. Heart size is mildly enlarged. There is no significant pericardial fluid, thickening or pericardial calcification. There is aortic atherosclerosis, as well as atherosclerosis of the great vessels of the mediastinum and the coronary arteries, including calcified atherosclerotic plaque in the left main, left anterior descending, left circumflex and right coronary arteries. Thickening calcification of the aortic valve. Mediastinum/Nodes: No pathologically enlarged mediastinal or hilar lymph nodes. Esophagus is unremarkable in appearance. No axillary lymphadenopathy. Lungs/Pleura: High-resolution images demonstrate no significant regions of ground-glass attenuation, septal thickening, subpleural reticulation, parenchymal banding, traction bronchiectasis or frank honeycombing. No acute consolidative  airspace disease. Trace right pleural effusion lying dependently. No left pleural effusion. Inspiratory and expiratory imaging is unremarkable. Upper Abdomen: Aortic atherosclerosis. Musculoskeletal: There are no aggressive appearing lytic or blastic lesions noted in the visualized portions of the skeleton. IMPRESSION: 1. No evidence of pulmonary embolism. 2. No findings to suggest interstitial lung disease. 3. Trace right  pleural effusion lying dependently. 4. Aortic atherosclerosis, in addition to left main and 3 vessel coronary artery disease. Assessment for potential risk factor modification, dietary therapy or pharmacologic therapy may be warranted, if clinically indicated. 5. There are calcifications of the aortic valve. Echocardiographic correlation for evaluation of potential valvular dysfunction may be warranted if clinically indicated. Aortic Atherosclerosis (ICD10-I70.0). Electronically Signed   By: Trudie Reed M.D.   On: 02/07/2019 09:15   CT CORONARY MORPH W/CTA COR W/SCORE W/CA W/CM &/OR WO/CM  Result Date: 02/08/2019 CLINICAL DATA:  Chest pain EXAM: Cardiac/Coronary CTA TECHNIQUE: The patient was scanned on a [employer]. A 100 kV prospective scan was triggered in the descending thoracic aorta at 111 HU's. Axial non-contrast 3 mm slices were carried out through the heart. The data set was analyzed on a dedicated work station and scored using the Agatson method. Gantry rotation speed was 250 msecs and collimation was .6 mm. No beta blockade and 0.8 mg of sl NTG was given. The 3D data set was reconstructed in 5% intervals of the 35-75 % of the R-R cycle. Diastolic phases were analyzed on a dedicated work station using MPR, MIP and VRT modes. The patient received 80 cc of contrast. FINDINGS: Image quality: average. Noise artifact is: Moderate signal-to-noise (obese). Coronary Arteries:  Normal coronary origin.  Right dominance. Left main: The left main is a large caliber vessel with a normal take off from the left coronary cusp that bifurcates to form a left anterior descending artery and a left circumflex artery. There is no plaque or stenosis. Left anterior descending artery: The proximal LAD is diffusely diseased with mild mixed density plaque (25-49%). The mid LAD contains moderate mixed density plaque (50-69%). The distal LAD is patent. One patent diagonal branch is seen. Left circumflex artery:  The LCX is non-dominant. The proximal and mid LCX contain mild mixed density plaque (25-49%). A small OM1 branch is seen that is patent. The LCX terminates as a patent OM2 branch. Right coronary artery: The ostial RCA contains mild mixed density plaque (25-49%). The proximal RCA contains moderate non-calcified plaque (50-69%). The mid and distal RCA contain minimal non-calcified plaque (<25%). The RCA terminates as a PDA that appears patent (limited evaluation in setting of obesity). Right Atrium: Right atrial size is within normal limits. Right Ventricle: The right ventricular cavity is within normal limits. Left Atrium: Left atrial size is normal in size with no left atrial appendage filling defect. Left Ventricle: The ventricular cavity size is within normal limits. There are no stigmata of prior infarction. There is no abnormal filling defect. Pulmonary arteries: Normal in size without proximal filling defect. Pulmonary veins: Normal pulmonary venous drainage. Pericardium: Normal thickness with no significant effusion or calcium present. Cardiac valves: The aortic valve is trileaflet with mild calcification. The mitral valve is normal structure without significant calcification. Aorta: Normal caliber with no significant disease. Extra-cardiac findings: See attached radiology report for non-cardiac structures. IMPRESSION: 1. Coronary calcium score of 546. This was 81th percentile for age and sex matched control. 2. Normal coronary origin with right dominance. 3. Moderate plaque (50-69%) in the mid LAD and proximal RCA. RECOMMENDATIONS:  4. Moderate stenosis (50-69%). Consider symptom-guided anti-ischemic pharmacotherapy as well as risk factor modification per guideline directed care. Additional analysis with CT FFR will be submitted. Lennie Odor, MD Electronically Signed   By: Lennie Odor   On: 02/08/2019 15:02   CT CORONARY FRACTIONAL FLOW RESERVE FLUID ANALYSIS  Result Date: 02/08/2019 EXAM: CT FFR  ANALYSIS CLINICAL DATA:  Chest pain FINDINGS: FFRct analysis was performed on the original cardiac CT angiogram dataset. Diagrammatic representation of the FFRct analysis is provided in a separate PDF document in PACS. This dictation was created using the PDF document and an interactive 3D model of the results. 3D model is not available in the EMR/PACS. Normal FFR range is >0.80. 1. Left Main: 0.96; no significant stenosis. 2. LAD: Mid LAD 0.90; No significant stenosis. 3. LCX: 0.95 no significant stenosis. 4. RCA: Proximal RCA 0.95; no significant stenosis. IMPRESSION: 1.  Negative CT FFR of mid LAD (0.90)  And proximal RCA (0.95). Lennie Odor, MD Electronically Signed   By: Lennie Odor   On: 02/08/2019 19:48   ECHOCARDIOGRAM COMPLETE  Result Date: 02/07/2019   ECHOCARDIOGRAM REPORT   Patient Name:   AVEREY TROMPETER Date of Exam: 02/07/2019 Medical Rec #:  [id number]        Height:       79.0 in Accession #:    [id number]       Weight:       315.2 lb Date of Birth:  28-Jul-1940         BSA:          2.77 m Patient Age:    78 years         BP:           192/79 mmHg Patient Gender: M                HR:           48 bpm. Exam Location:  Inpatient Procedure: 2D Echo Indications:    Dyspnea 786.09 / R06.00  History:        Patient has no prior history of Echocardiogram examinations.                 Signs/Symptoms:Shortness of Breath; Risk Factors:Hypertension.  Sonographer:    Leeroy Bock Turrentine Referring Phys: [id number] Laurell Josephs IMPRESSIONS  1. Left ventricular ejection fraction, by visual estimation, is 55 to 60%. The left ventricle has normal function. Left ventricular septal wall thickness was mildly increased. Mildly increased left ventricular posterior wall thickness. There is mildly increased left ventricular hypertrophy.  2. Left ventricular diastolic parameters are consistent with Grade II diastolic dysfunction (pseudonormalization).  3. Mildly dilated left ventricular internal cavity size.  4. The  left ventricle has no regional wall motion abnormalities.  5. Elevated LVEDP.  6. Global right ventricle has normal systolic function.The right ventricular size is normal. No increase in right ventricular wall thickness.  7. Left atrial size was moderately dilated.  8. Right atrial size was normal.  9. The mitral valve is normal in structure. Trivial mitral valve regurgitation. No evidence of mitral stenosis. 10. The tricuspid valve is normal in structure. 11. Aortic valve regurgitation is severe. 12. The aortic valve is tricuspid. Aortic valve regurgitation is severe. Mild aortic valve stenosis. 13. Pulmonic regurgitation is mild. 14. The pulmonic valve was normal in structure. Pulmonic valve regurgitation is mild. 15. Mildly elevated pulmonary artery systolic pressure. FINDINGS  Left Ventricle: Left ventricular ejection fraction, by visual estimation, is 55  to 60%. The left ventricle has normal function. The left ventricle has no regional wall motion abnormalities. The left ventricular internal cavity size was mildly dilated left ventricle. Mildly increased left ventricular posterior wall thickness. There is mildly increased left ventricular hypertrophy. Left ventricular diastolic parameters are consistent with Grade II diastolic dysfunction (pseudonormalization). Normal left atrial pressure. Elevated LVEDP. Right Ventricle: The right ventricular size is normal. No increase in right ventricular wall thickness. Global RV systolic function is has normal systolic function. The tricuspid regurgitant velocity is 2.59 m/s, and with an assumed right atrial pressure  of 8 mmHg, the estimated right ventricular systolic pressure is mildly elevated at 34.8 mmHg. Left Atrium: Left atrial size was moderately dilated. Right Atrium: Right atrial size was normal in size Pericardium: There is no evidence of pericardial effusion. Mitral Valve: The mitral valve is normal in structure. Trivial mitral valve regurgitation. No evidence  of mitral valve stenosis by observation. Tricuspid Valve: The tricuspid valve is normal in structure. Tricuspid valve regurgitation is trivial. Aortic Valve: The aortic valve is tricuspid. . There is mild thickening and mild calcification of the aortic valve. Aortic valve regurgitation is severe. Mild aortic stenosis is present. There is mild thickening of the aortic valve. There is mild calcification of the aortic valve. Aortic valve mean gradient measures 10.0 mmHg. Aortic valve peak gradient measures 17.8 mmHg. Aortic valve area, by VTI measures 1.78 cm. Pulmonic Valve: The pulmonic valve was normal in structure. Pulmonic valve regurgitation is mild. Pulmonic regurgitation is mild. Aorta: The aortic root, ascending aorta and aortic arch are all structurally normal, with no evidence of dilitation or obstruction. IAS/Shunts: No atrial level shunt detected by color flow Doppler. There is no evidence of a patent foramen ovale. No ventricular septal defect is seen or detected. There is no evidence of an atrial septal defect.  LEFT VENTRICLE PLAX 2D LVIDd:         5.73 cm  Diastology LVIDs:         3.88 cm  LV e' lateral:   11.30 cm/s LV PW:         1.22 cm  LV E/e' lateral: 9.0 LV IVS:        1.20 cm  LV e' medial:    5.87 cm/s LVOT diam:     2.00 cm  LV E/e' medial:  17.4 LV SV:         97 ml LV SV Index:   33.84 LVOT Area:     3.14 cm  RIGHT VENTRICLE RV S prime:     15.40 cm/s TAPSE (M-mode): 2.8 cm LEFT ATRIUM              Index       RIGHT ATRIUM           Index LA diam:        5.90 cm  2.13 cm/m  RA Area:     23.70 cm LA Vol (A2C):   102.0 ml 36.89 ml/m RA Volume:   66.60 ml  24.09 ml/m LA Vol (A4C):   91.0 ml  32.91 ml/m LA Biplane Vol: 101.0 ml 36.53 ml/m  AORTIC VALVE AV Area (Vmax):    1.74 cm AV Area (Vmean):   1.57 cm AV Area (VTI):     1.78 cm AV Vmax:           211.00 cm/s AV Vmean:          149.000 cm/s AV VTI:  0.549 m AV Peak Grad:      17.8 mmHg AV Mean Grad:      10.0 mmHg LVOT  Vmax:         117.00 cm/s LVOT Vmean:        74.300 cm/s LVOT VTI:          0.311 m LVOT/AV VTI ratio: 0.57  AORTA Ao Root diam: 3.20 cm MITRAL VALVE                         TRICUSPID VALVE MV Area (PHT): 2.32 cm              TR Peak grad:   26.8 mmHg MV PHT:        94.83 msec            TR Vmax:        261.00 cm/s MV Decel Time: 327 msec MV E velocity: 102.00 cm/s 103 cm/s  SHUNTS MV A velocity: 57.80 cm/s  70.3 cm/s Systemic VTI:  0.31 m MV E/A ratio:  1.76        1.5       Systemic Diam: 2.00 cm  Chilton Si MD Electronically signed by Chilton Si MD Signature Date/Time: 02/07/2019/2:23:08 PM    Final    VAS [place] LOWER EXTREMITY VENOUS (DVT)  Result Date: 02/07/2019  Lower Venous Study Indications: Swelling.  Risk Factors: None identified. Limitations: Poor ultrasound/tissue interface. Comparison Study: No prior studies. Performing Technologist: Chanda Busing RVT  Examination Guidelines: A complete evaluation includes B-mode imaging, spectral Doppler, color Doppler, and power Doppler as needed of all accessible portions of each vessel. Bilateral testing is considered an integral part of a complete examination. Limited examinations for reoccurring indications may be performed as noted.  +---------+---------------+---------+-----------+----------+--------------+ RIGHT    CompressibilityPhasicitySpontaneityPropertiesThrombus Aging +---------+---------------+---------+-----------+----------+--------------+ CFV      Full           Yes      Yes                                 +---------+---------------+---------+-----------+----------+--------------+ SFJ      Full                                                        +---------+---------------+---------+-----------+----------+--------------+ FV Prox  Full                                                        +---------+---------------+---------+-----------+----------+--------------+ FV Mid   Full                                                         +---------+---------------+---------+-----------+----------+--------------+ FV DistalFull                                                        +---------+---------------+---------+-----------+----------+--------------+  PFV      Full                                                        +---------+---------------+---------+-----------+----------+--------------+ POP      Full           Yes      Yes                                 +---------+---------------+---------+-----------+----------+--------------+ PTV      Full                                                        +---------+---------------+---------+-----------+----------+--------------+ PERO     Full                                                        +---------+---------------+---------+-----------+----------+--------------+   +---------+---------------+---------+-----------+----------+--------------+ LEFT     CompressibilityPhasicitySpontaneityPropertiesThrombus Aging +---------+---------------+---------+-----------+----------+--------------+ CFV      Full           Yes      Yes                                 +---------+---------------+---------+-----------+----------+--------------+ SFJ      Full                                                        +---------+---------------+---------+-----------+----------+--------------+ FV Prox  Full                                                        +---------+---------------+---------+-----------+----------+--------------+ FV Mid   Full                                                        +---------+---------------+---------+-----------+----------+--------------+ FV DistalFull                                                        +---------+---------------+---------+-----------+----------+--------------+ PFV      Full                                                         +---------+---------------+---------+-----------+----------+--------------+  POP      Full           Yes      Yes                                 +---------+---------------+---------+-----------+----------+--------------+ PTV      Full                                                        +---------+---------------+---------+-----------+----------+--------------+ PERO     Full                                                        +---------+---------------+---------+-----------+----------+--------------+     Summary: Right: There is no evidence of deep vein thrombosis in the lower extremity. No cystic structure found in the popliteal fossa. Left: There is no evidence of deep vein thrombosis in the lower extremity. No cystic structure found in the popliteal fossa.  *See table(s) above for measurements and observations. Electronically signed by Lemar Livings MD on 02/07/2019 at 3:46:48 PM.    Final    Disposition   Pt is being discharged home today in good condition.  Follow-up Plans & Appointments    Follow-up Information    Jodelle Gross, NP Follow up on 03/01/2019.   Specialties: Nurse Practitioner, Radiology, Cardiology Why: at 2:45pm for your follow up appt.  Contact information: [address] [place] [address] [phone]          Discharge Instructions    Diet - low sodium heart healthy   Complete by: As directed    Increase activity slowly   Complete by: As directed       Discharge Medications     Medication List    STOP taking these medications   metoprolol tartrate 50 MG tablet Commonly known as: LOPRESSOR     TAKE these medications   allopurinol 300 MG tablet Commonly known as: ZYLOPRIM Take 300 mg by mouth daily.   amLODipine 10 MG tablet Commonly known as: NORVASC Take 1 tablet (10 mg total) by mouth daily.   aspirin EC 81 MG tablet Take 81 mg by mouth daily.   guaiFENesin 600 MG 12 hr tablet Commonly known  as: MUCINEX Take 600 mg by mouth 2 (two) times daily as needed for to loosen phlegm.   hydrochlorothiazide 12.5 MG capsule Commonly known as: Microzide Take 1 capsule (12.5 mg total) by mouth daily.   losartan 100 MG tablet Commonly known as: COZAAR Take 100 mg by mouth daily.   metoprolol succinate 25 MG 24 hr tablet Commonly known as: TOPROL-XL Take 1 tablet (25 mg total) by mouth daily. What changed:   medication strength  how much to take  when to take this  additional instructions   rosuvastatin 40 MG tablet Commonly known as: CRESTOR Take 1 tablet (40 mg total) by mouth daily at 6 PM.   sertraline 25 MG tablet Commonly known as: ZOLOFT Take 12.5 mg by mouth daily.       No  Did the patient have a percutaneous coronary intervention (stent / angioplasty)?:  No.      Outstanding Labs/Studies   FLP/LFTs in 8 weeks   Duration of Discharge Encounter   Greater than 30 minutes including physician time.  Signed, Laverda Page NP-C 02/09/2019, 10:39 AM   ATTENDING ATTESTATION  I have seen, examined and evaluated the patient this AM along with Laverda Page, NP-C on rounds. After reviewing all the available data and chart, we discussed the patients laboratory, study & physical findings as well as symptoms in detail. I agree with her findings, examination as well as impression recommendations as per our discussion.    He feels much better than he did when he came in.  I suspect that his presentation was consistent with hypertensive urgency with flash pulmonary edema treated with blood pressure control and diuretic.  As such the diagnosis would be considered to be ACCELERATED HYPERTENSION with ACUTE ON CHRONIC DIASTOLIC HEART FAILURE.  I think is not unreasonable to add HCTZ for some diuretic component weight.  He has had significant bradycardia for which we have backed off on his beta-blocker.  In doing so we have increased his  amlodipine and restarted ARB.  I do think he probably needs close follow-up to adjust medications.  He is determining whether or not he wants to follow-up with cardiology long-term. He does have moderate, but nonobstructive CAD by coronary CTA and relatively normal echocardiogram with some moderate valvular disease.  This can be followed up with either his PCP or with a cardiologist.  But I suspect that he may want to do intermittent follow-ups with cardiology.  I will be happy to see him in clinic.  He is on rosuvastatin, which is appropriate for CAD.  Otherwise okay for discharge   Bryan Lemma, M.D., M.S. Interventional Cardiologist   Pager # [phone] Phone # [phone] [address] [place], [place] [address]

## 2019-02-09 NOTE — Plan of Care (Signed)

## 2019-02-09 NOTE — Discharge Instructions (Signed)

## 2019-02-28 NOTE — Progress Notes (Deleted)
Cardiology Office Note   Date:  02/28/2019   ID:  Tyler Macdonald, DOB 1940-09-05, MRN [id number]  PCP:  Rory Percy, MD  Cardiologist: Dr. Ellyn Hack  No chief complaint on file.    History of Present Illness: Tyler Macdonald is a 79 y.o. male who presents for post hospitalization after admission on 02/07/2019 with complaints of dyspnea and feeling of suffocating. He was admitted with hypertensive urgency after initially being seen at [facility]. He was treated with lasix 20 mg, hydralazine IV along with NTG paste. He was negative for PE per CT. Cardiac CTA was also completed which was negative for significant stenosis, but was found to have moderate stenosis in the LAD and RCA. It was thought that dyspnea was due to severe hypertension.   Echocardiogram revealed severe AoV regurgitation. He was recommended to have repeat echo in 3 months after BP was consistently controlled   After BP was controlled, medications were adjusted. Metoprolol was decreased from 75 mg to 25 mg due to bradycardia, amlodipine 10 mg daily and HCTZ was added. He was to continue losartan 100 mg daily.   No past medical history on file.  *** The histories are not reviewed yet. Please review them in the "History" navigator section and refresh this [place].   Current Outpatient Medications  Medication Sig Dispense Refill  . allopurinol (ZYLOPRIM) 300 MG tablet Take 300 mg by mouth daily.    Marland Kitchen amLODipine (NORVASC) 10 MG tablet Take 1 tablet (10 mg total) by mouth daily. 30 tablet 0  . aspirin EC 81 MG tablet Take 81 mg by mouth daily.    Marland Kitchen guaiFENesin (MUCINEX) 600 MG 12 hr tablet Take 600 mg by mouth 2 (two) times daily as needed for to loosen phlegm.    . hydrochlorothiazide (MICROZIDE) 12.5 MG capsule Take 1 capsule (12.5 mg total) by mouth daily. 30 capsule 0  . losartan (COZAAR) 100 MG tablet Take 100 mg by mouth daily.    . metoprolol succinate (TOPROL-XL) 25 MG 24 hr tablet Take 1 tablet (25 mg total) by  mouth daily. 30 tablet 0  . rosuvastatin (CRESTOR) 40 MG tablet Take 1 tablet (40 mg total) by mouth daily at 6 PM. 30 tablet 0  . sertraline (ZOLOFT) 25 MG tablet Take 12.5 mg by mouth daily.     No current facility-administered medications for this visit.    Allergies:   Cephalexin and Penicillin g    Social History:  The patient     Family History:  The patient's family history is not on file.    ROS: All other systems are reviewed and negative. Unless otherwise mentioned in H&P    PHYSICAL EXAM: VS:  There were no vitals taken for this visit. , BMI There is no height or weight on file to calculate BMI. GEN: Well nourished, well developed, in no acute distress HEENT: normal Neck: no JVD, carotid bruits, or masses Cardiac: ***RRR; no murmurs, rubs, or gallops,no edema  Respiratory:  Clear to auscultation bilaterally, normal work of breathing GI: soft, nontender, nondistended, + BS MS: no deformity or atrophy Skin: warm and dry, no rash Neuro:  Strength and sensation are intact Psych: euthymic mood, full affect   EKG:  EKG {ACTION; IS/IS MBT:[id number]} ordered today. The ekg ordered today demonstrates ***   Recent Labs: 02/07/2019: ALT 17; B Natriuretic Peptide 286.5; Hemoglobin 12.2; Magnesium 1.8; Platelets 301; TSH 0.636 02/09/2019: BUN 22; Creatinine, Ser 1.30; Potassium 4.2; Sodium 139    Lipid  Panel    Component Value Date/Time   CHOL 196 03-07-19 0533   TRIG 59 03-07-19 0533   HDL 38 (L) 2019-03-07 0533   CHOLHDL 5.2 Mar 07, 2019 0533   VLDL 12 03/07/2019 0533   LDLCALC 146 (H) 03/07/19 0533      Wt Readings from Last 3 Encounters:  02/09/19 (!) 307 lb 1.6 oz (139.3 kg)      Other studies Reviewed: Echocardiogram March 07, 2019  1. Left ventricular ejection fraction, by visual estimation, is 55 to 60%. The left ventricle has normal function. Left ventricular septal wall thickness was mildly increased. Mildly increased left ventricular posterior wall  thickness. There is mildly  increased left ventricular hypertrophy.  2. Left ventricular diastolic parameters are consistent with Grade II diastolic dysfunction (pseudonormalization).  3. Mildly dilated left ventricular internal cavity size.  4. The left ventricle has no regional wall motion abnormalities.  5. Elevated LVEDP.  6. Global right ventricle has normal systolic function.The right ventricular size is normal. No increase in right ventricular wall thickness.  7. Left atrial size was moderately dilated.  8. Right atrial size was normal.  9. The mitral valve is normal in structure. Trivial mitral valve regurgitation. No evidence of mitral stenosis. 10. The tricuspid valve is normal in structure. 11. Aortic valve regurgitation is severe. 12. The aortic valve is tricuspid. Aortic valve regurgitation is severe. Mild aortic valve stenosis. 13. Pulmonic regurgitation is mild. 14. The pulmonic valve was normal in structure. Pulmonic valve regurgitation is mild. 15. Mildly elevated pulmonary artery systolic pressure.   ASSESSMENT AND PLAN:  1.  ***   Current medicines are reviewed at length with the patient today.    Labs/ tests ordered today include: *** [name]. Liborio Nixon, ANP, AACC   02/28/2019 1:18 PM    [facility] [address] Office [phone] Fax [phone]  Notice: This dictation was prepared with Dragon dictation along with smaller phrase technology. Any transcriptional errors that result from this process are unintentional and may not be corrected upon review.

## 2019-03-01 ENCOUNTER — Ambulatory Visit: Payer: Medicare Other | Admitting: Adult Health

## 2019-04-24 ENCOUNTER — Encounter: Payer: Self-pay | Admitting: Adult Health

## 2019-06-19 ENCOUNTER — Other Ambulatory Visit: Payer: Self-pay

## 2019-06-19 ENCOUNTER — Ambulatory Visit
Admission: EM | Admit: 2019-06-19 | Discharge: 2019-06-19 | Disposition: A | Discharge status: Home / Self Care | Payer: Medicare Other | Attending: Emergency Medicine | Admitting: Emergency Medicine

## 2019-06-19 ENCOUNTER — Encounter: Payer: Self-pay | Admitting: Emergency Medicine

## 2019-06-19 DIAGNOSIS — L02212 Cutaneous abscess of back [any part, except buttock]: Secondary | ICD-10-CM | POA: Diagnosis not present

## 2019-06-19 HISTORY — DX: Pure hypercholesterolemia, unspecified: E78.00

## 2019-06-19 HISTORY — DX: Gout, unspecified: M10.9

## 2019-06-19 HISTORY — DX: Essential (primary) hypertension: I10

## 2019-06-19 MED ORDER — DOXYCYCLINE HYCLATE 100 MG PO CAPS: 100.0000 mg | ORAL_CAPSULE | Freq: Two times a day (BID) | ORAL | 0 refills | Status: DC

## 2019-06-19 NOTE — ED Triage Notes (Signed)
Cyst to waist line, lower back.  Noticed pain 203 weeks ago.  Denies anything similar to this before

## 2019-06-19 NOTE — ED Provider Notes (Signed)
[facility]   [id number] 06/19/19 Arrival Time: 1606   [id number]  SUBJECTIVE:  Tyler Macdonald is a 79 y.o. male who presents with a possible abscess of his back x 2-3 weeks.  Denies precipitating event or trauma.  Denies alleviating factors.  Worse to the touch.  Denies previous symptoms.  Complains of mild swelling and erythema.  Denies fever, chills, nausea, vomiting.    ROS: As per HPI.  All other pertinent ROS negative.     Past Medical History:  Diagnosis Date  . Gout   . High cholesterol   . Hypertension    Past Surgical History:  Procedure Laterality Date  . KNEE SURGERY Right   . SHOULDER SURGERY     Allergies  Allergen Reactions  . Cephalexin   . Penicillin G Hives    Did it involve swelling of the face/tongue/throat, SOB, or low BP? No Did it involve sudden or severe rash/hives, skin peeling, or any reaction on the inside of your mouth or nose? Yes Did you need to seek medical attention at a hospital or doctor's office? Yes When did it last happen? If all above answers are "NO", may proceed with cephalosporin use.   No current facility-administered medications on file prior to encounter.   Current Outpatient Medications on File Prior to Encounter  Medication Sig Dispense Refill  . allopurinol (ZYLOPRIM) 300 MG tablet Take 300 mg by mouth daily.    Marland Kitchen amLODipine (NORVASC) 10 MG tablet Take 1 tablet (10 mg total) by mouth daily. 30 tablet 0  . aspirin EC 81 MG tablet Take 81 mg by mouth daily.    Marland Kitchen guaiFENesin (MUCINEX) 600 MG 12 hr tablet Take 600 mg by mouth 2 (two) times daily as needed for to loosen phlegm.    . hydrochlorothiazide (MICROZIDE) 12.5 MG capsule Take 1 capsule (12.5 mg total) by mouth daily. 30 capsule 0  . metoprolol succinate (TOPROL-XL) 25 MG 24 hr tablet Take 1 tablet (25 mg total) by mouth daily. 30 tablet 0  . rosuvastatin (CRESTOR) 40 MG tablet Take 1 tablet (40 mg total) by mouth daily at 6 PM. 30 tablet 0  .  sertraline (ZOLOFT) 25 MG tablet Take 12.5 mg by mouth daily.    . [DISCONTINUED] losartan (COZAAR) 100 MG tablet Take 100 mg by mouth daily.     Social History   Socioeconomic History  . Marital status: Divorced    Spouse name: Not on file  . Number of children: Not on file  . Years of education: Not on file  . Highest education level: Not on file  Occupational History  . Not on file  Tobacco Use  . Smoking status: Never Smoker  Substance and Sexual Activity  . Alcohol use: Never  . Drug use: Never  . Sexual activity: Not on file  Other Topics Concern  . Not on file  Social History Narrative  . Not on file   Social Determinants of Health   Financial Resource Strain:   . Difficulty of Paying Living Expenses:   Food Insecurity:   . Worried About Programme researcher, broadcasting/film/video in the Last Year:   . Barista in the Last Year:   Transportation Needs:   . Freight forwarder (Medical):   Marland Kitchen Lack of Transportation (Non-Medical):   Physical Activity:   . Days of Exercise per Week:   . Minutes of Exercise per Session:   Stress:   . Feeling of Stress :  Social Connections:   . Frequency of Communication with Friends and Family:   . Frequency of Social Gatherings with Friends and Family:   . Attends Religious Services:   . Active Member of Clubs or Organizations:   . Attends Archivist Meetings:   Marland Kitchen Marital Status:   Intimate Partner Violence:   . Fear of Current or Ex-Partner:   . Emotionally Abused:   Marland Kitchen Physically Abused:   . Sexually Abused:    History reviewed. No pertinent family history.  OBJECTIVE:  Vitals:   06/19/19 1644  BP: (!) 176/79  Pulse: (!) 52  Resp: 18  Temp: 98.4 F (36.9 C)  TempSrc: Oral  SpO2: 96%     General appearance: alert; no distress Skin: 2-3 cm induration of his LT low back; tender to touch; no active drainage or bleeding Psychological: alert and cooperative; normal mood and affect  ASSESSMENT & PLAN:  1. Abscess of  lower back     Meds ordered this encounter  Medications  . doxycycline (VIBRAMYCIN) 100 MG capsule    Sig: Take 1 capsule (100 mg total) by mouth 2 (two) times daily.    Dispense:  20 capsule    Refill:  0    Order Specific Question:   Supervising Provider    Answer:   Raylene Everts [[id number]]   Apply warm compresses 3-4x daily for 10-15 minutes Wash site daily with warm water and mild soap Keep covered to avoid friction Take antibiotic as prescribed and to completion Follow up here or with PCP if symptoms persists Return or go to the ED if you have any new or worsening symptoms increased redness, swelling, pain, nausea, vomiting, fever, chills, etc...    Reviewed expectations re: course of current medical issues. Questions answered. Outlined signs and symptoms indicating need for more acute intervention. Patient verbalized understanding. After Visit Summary given.          Lestine Box, PA-C 06/19/19 1657

## 2019-06-19 NOTE — Discharge Instructions (Addendum)
Apply warm compresses 3-4x daily for 10-15 minutes Wash site daily with warm water and mild soap Keep covered to avoid friction Take antibiotic as prescribed and to completion Follow up here or with PCP if symptoms persists Return or go to the ED if you have any new or worsening symptoms increased redness, swelling, pain, nausea, vomiting, fever, chills, etc...  

## 2020-11-01 IMAGING — CT CT ANGIO CHEST
1 of 10 series · 12 of 38 positions shown · IV contrast (agent unspecified)
Comparison: No priors.

CLINICAL DATA: 78-year-old male with history of chronic productive
cough for the past 3-4 months. Evaluate for interstitial lung
disease.

EXAM:
CT CHEST WITHOUT CONTRAST
CT ANGIOGRAPHY CHEST WITH CONTRAST
TECHNIQUE: Multidetector CT imaging of the chest was performed following the
standard protocol without intravenous contrast. High resolution
imaging of the lungs, as well as inspiratory and expiratory imaging,
was performed. Additional multidetector CT imaging of the chest was
performed using the standard protocol during bolus administration of
intravenous contrast. Multiplanar CT image reconstructions and MIPs
were obtained to evaluate the vascular anatomy.

[Series 13: pe 3mm · axial · 0.87mm/px · z∈[+1198,+1430]mm · 12 of 91 slices shown]
[im 7/91  lung]
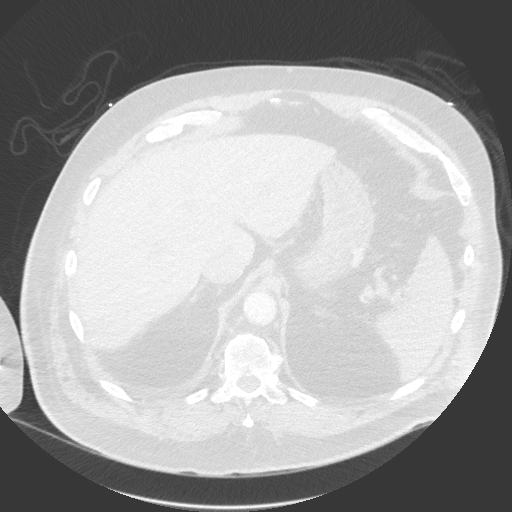
[im 14/91  mediastinal]
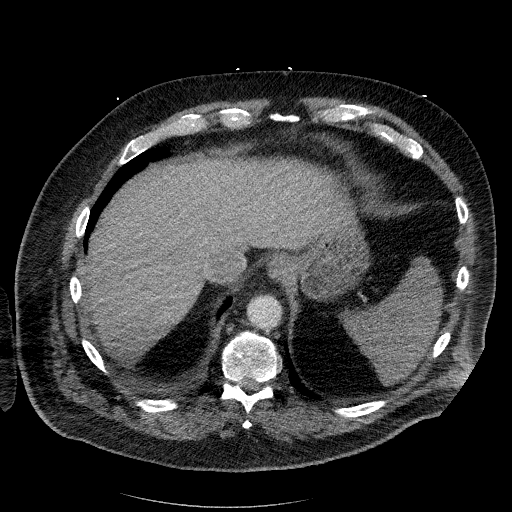
[im 21/91  lung]
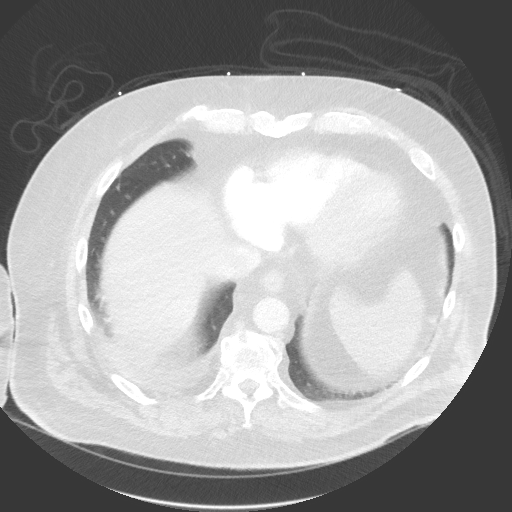
[im 28/91  mediastinal]
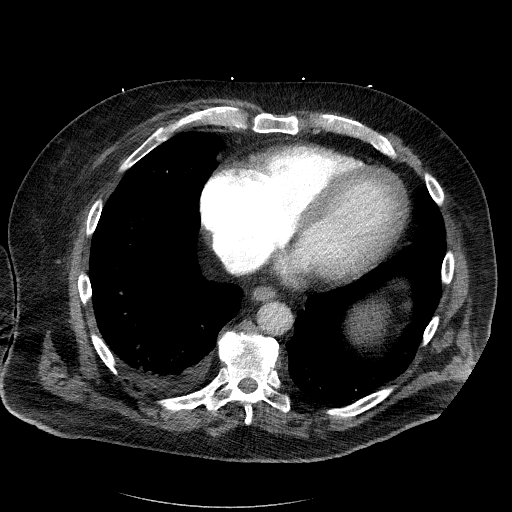
[im 35/91  lung]
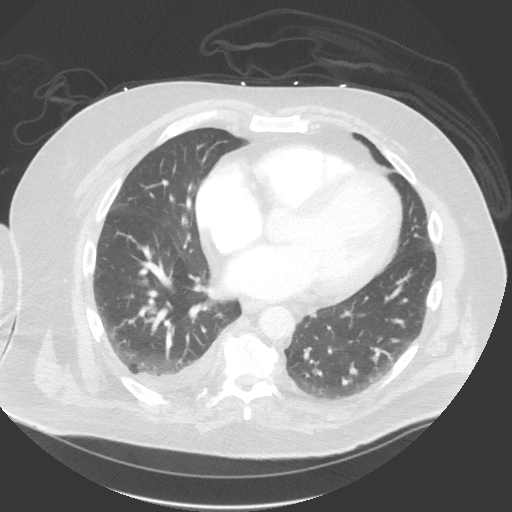
[im 42/91  mediastinal]
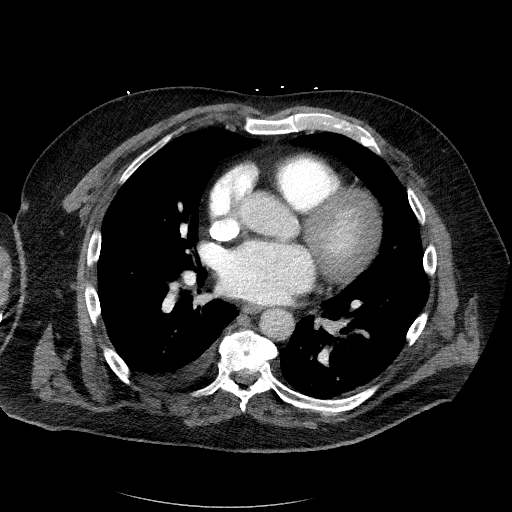
[im 49/91  lung]
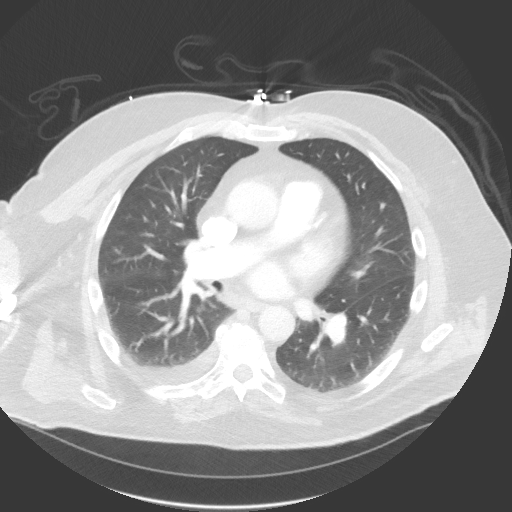
[im 56/91  mediastinal]
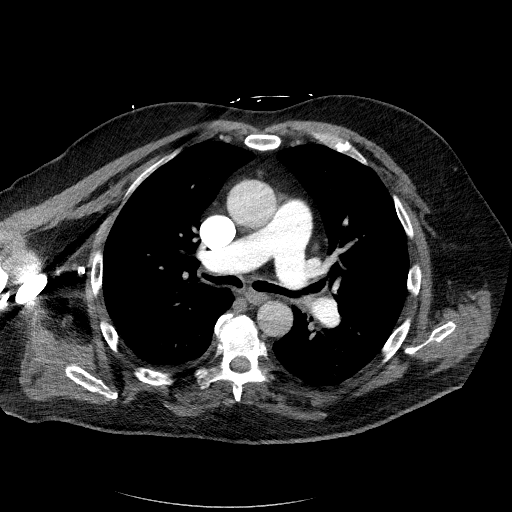
[im 63/91  lung]
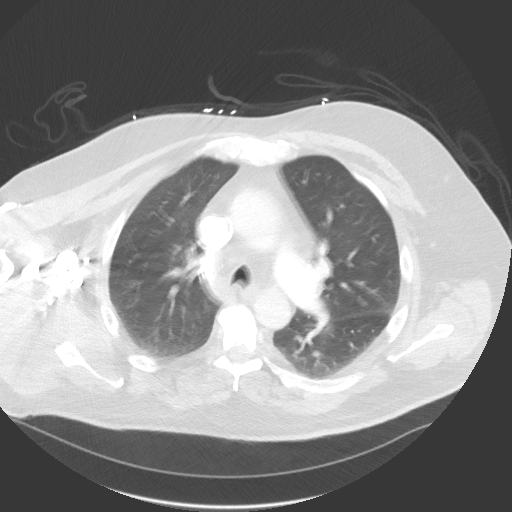
[im 70/91  mediastinal]
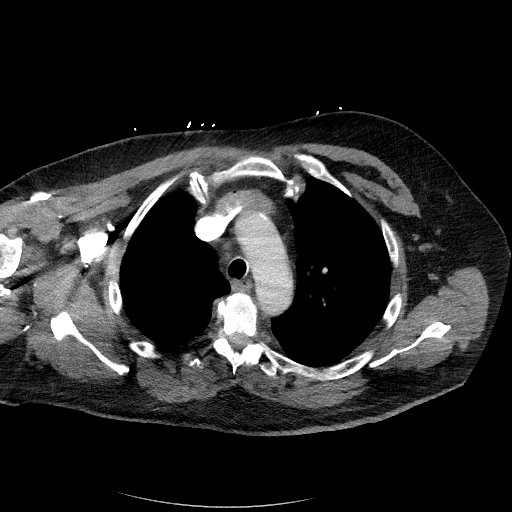
[im 77/91  lung]
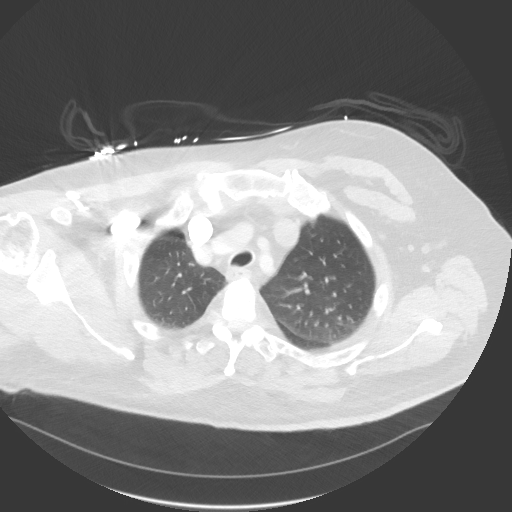
[im 84/91  mediastinal]
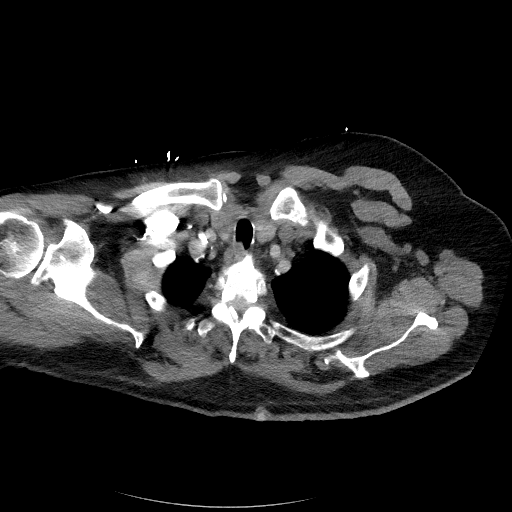

[12 of 38 positions shown; findings below may reference images not displayed]

FINDINGS: Cardiovascular: No filling defects within the pulmonary arterial
tree to suggest pulmonary embolism. Heart size is mildly enlarged.
There is no significant pericardial fluid, thickening or pericardial
calcification. There is aortic atherosclerosis, as well as
atherosclerosis of the great vessels of the mediastinum and the
coronary arteries, including calcified atherosclerotic plaque in the
left main, left anterior descending, left circumflex and right
coronary arteries. Thickening calcification of the aortic valve.

Mediastinum/Nodes: No pathologically enlarged mediastinal or hilar
lymph nodes. Esophagus is unremarkable in appearance. No axillary
lymphadenopathy.

Lungs/Pleura: High-resolution images demonstrate no significant
regions of ground-glass attenuation, septal thickening, subpleural
reticulation, parenchymal banding, traction bronchiectasis or frank
honeycombing. No acute consolidative airspace disease. Trace right
pleural effusion lying dependently. No left pleural effusion.
Inspiratory and expiratory imaging is unremarkable.

Upper Abdomen: Aortic atherosclerosis.

Musculoskeletal: There are no aggressive appearing lytic or blastic
lesions noted in the visualized portions of the skeleton.
IMPRESSION: 1. No evidence of pulmonary embolism.
2. No findings to suggest interstitial lung disease.
3. Trace right pleural effusion lying dependently.
4. Aortic atherosclerosis, in addition to left main and 3 vessel
coronary artery disease. Assessment for potential risk factor
modification, dietary therapy or pharmacologic therapy may be
warranted, if clinically indicated.
5. There are calcifications of the aortic valve. Echocardiographic
correlation for evaluation of potential valvular dysfunction may be
warranted if clinically indicated.

Aortic Atherosclerosis (6P0EI-TAV.V).

## 2020-11-01 IMAGING — CT CT CHEST HIGH RESOLUTION W/O CM
1 of 9 series · 12 of 37 positions shown, 15 images · IV contrast (agent unspecified)
Comparison: No priors.

CLINICAL DATA: 78-year-old male with history of chronic productive
cough for the past 3-4 months. Evaluate for interstitial lung
disease.

EXAM:
CT CHEST WITHOUT CONTRAST
CT ANGIOGRAPHY CHEST WITH CONTRAST
TECHNIQUE: Multidetector CT imaging of the chest was performed following the
standard protocol without intravenous contrast. High resolution
imaging of the lungs, as well as inspiratory and expiratory imaging,
was performed. Additional multidetector CT imaging of the chest was
performed using the standard protocol during bolus administration of
intravenous contrast. Multiplanar CT image reconstructions and MIPs
were obtained to evaluate the vascular anatomy.

[Series 10: chest w/o sft thins · axial · non-contrast · 0.58mm/px · z∈[+1167,+1420]mm · 12 of 446 slices shown, 15 images]
[im 27/446  mediastinal]
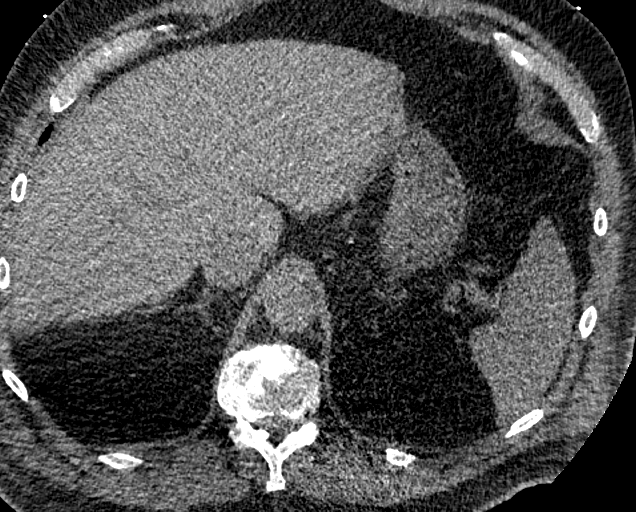
[im 27/446  lung]
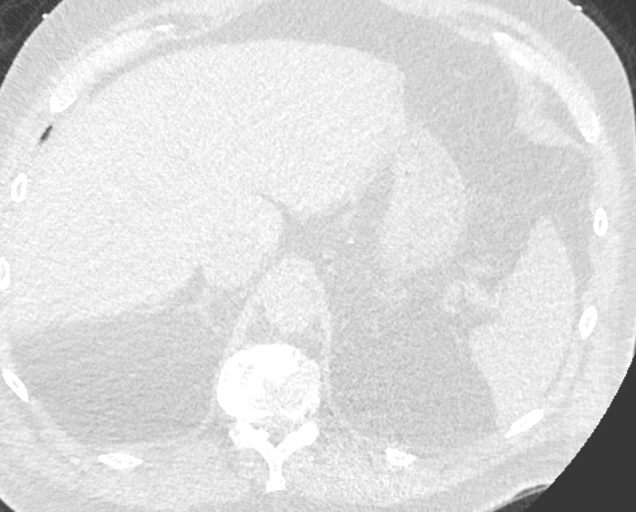
[im 79/446  lung]
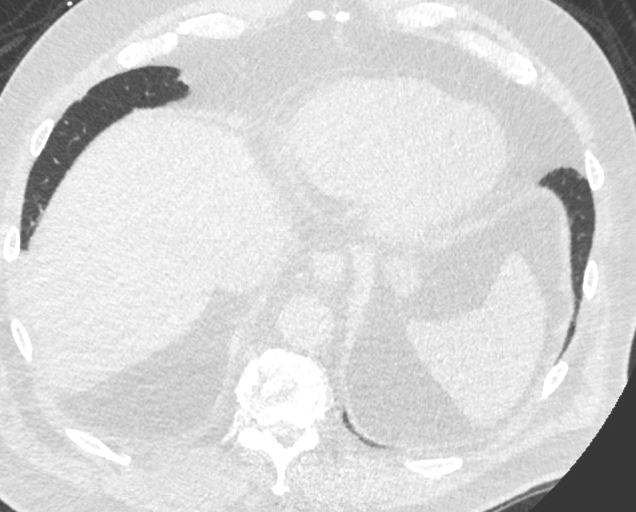
[im 105/446  lung]
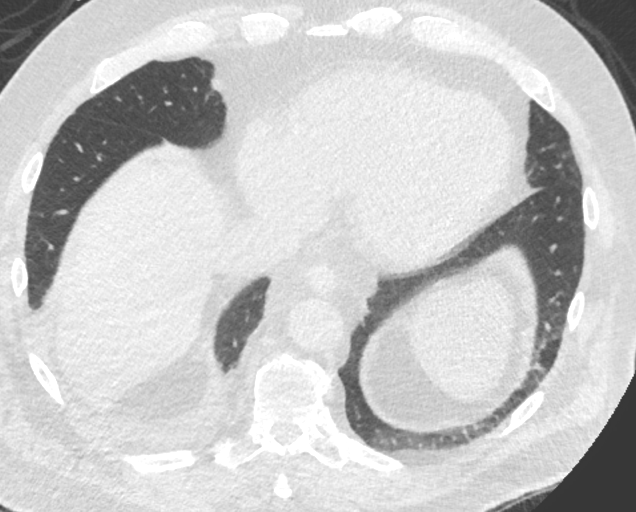
[im 131/446  lung]
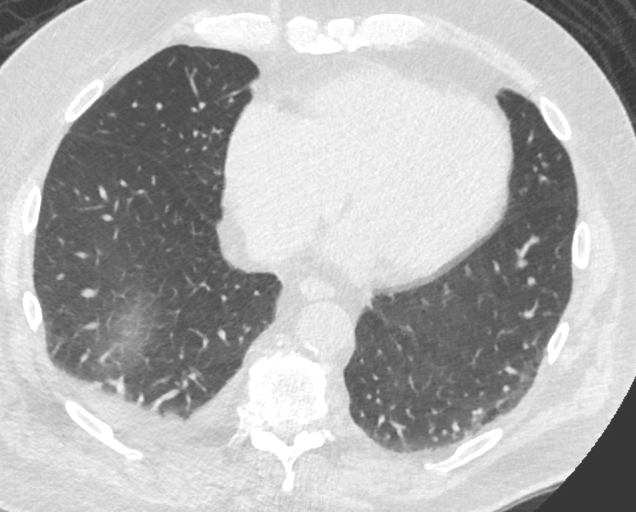
[im 184/446  mediastinal]
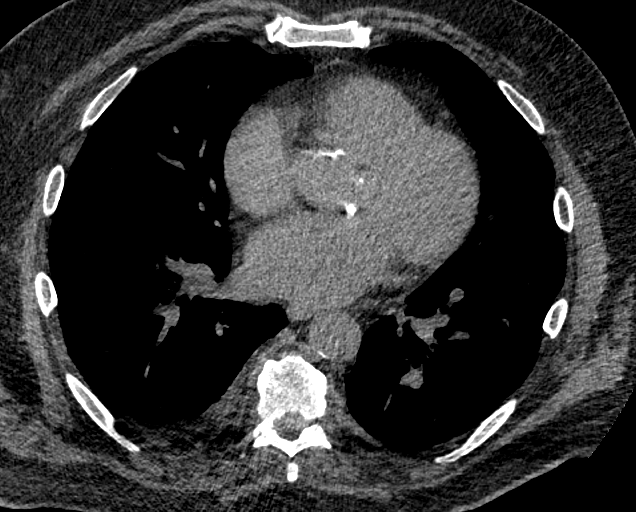
[im 184/446  lung]
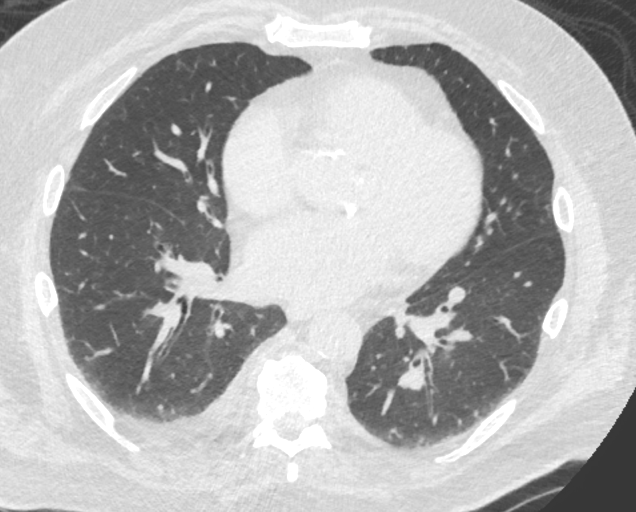
[im 210/446  lung]
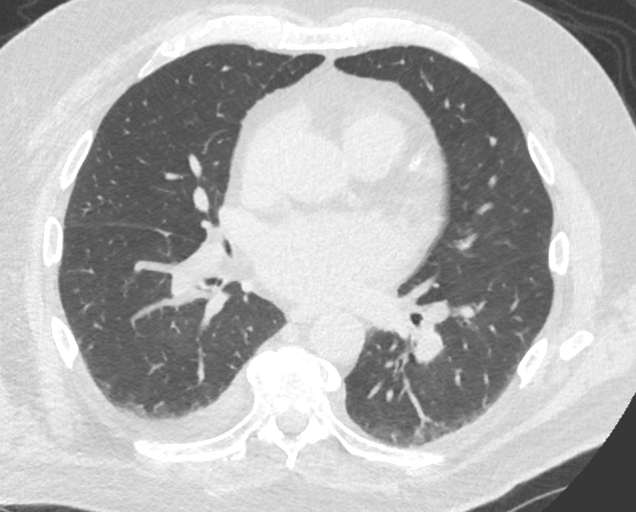
[im 236/446  lung]
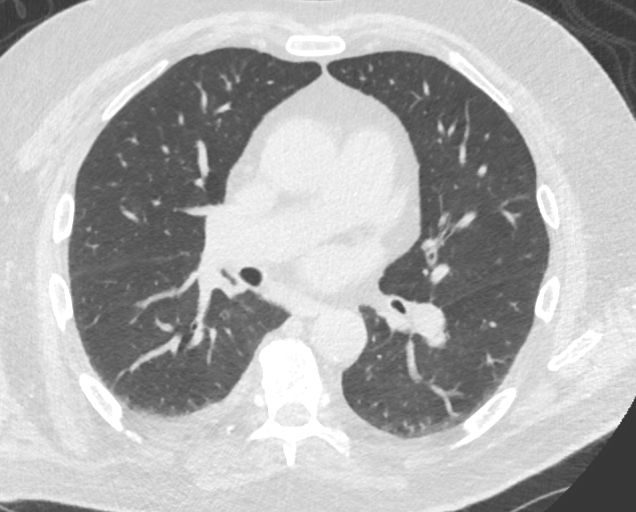
[im 288/446  lung]
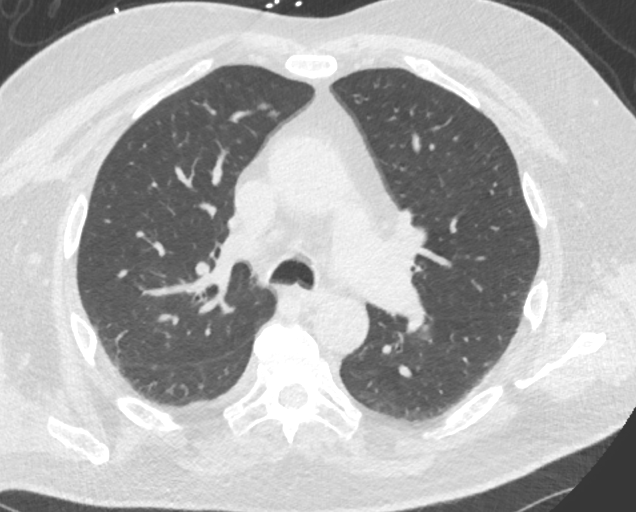
[im 315/446  mediastinal]
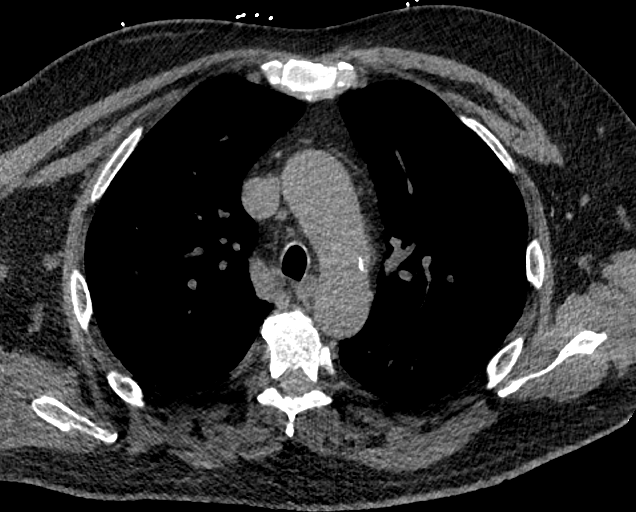
[im 315/446  lung]
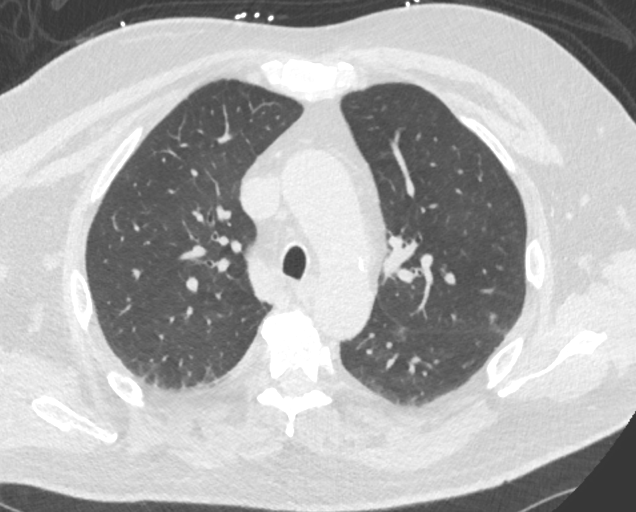
[im 341/446  lung]
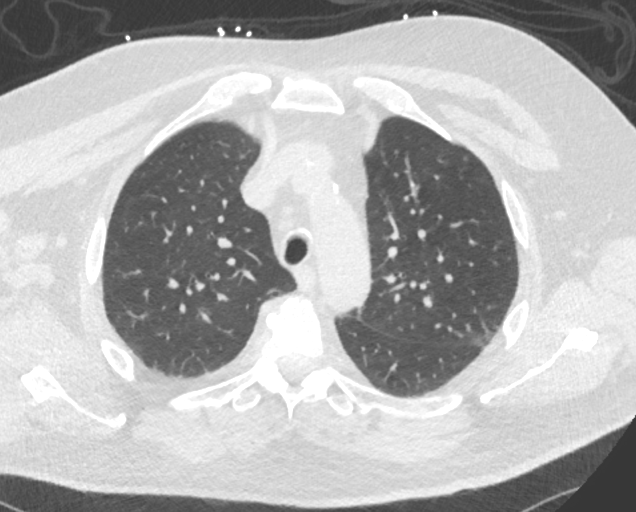
[im 393/446  lung]
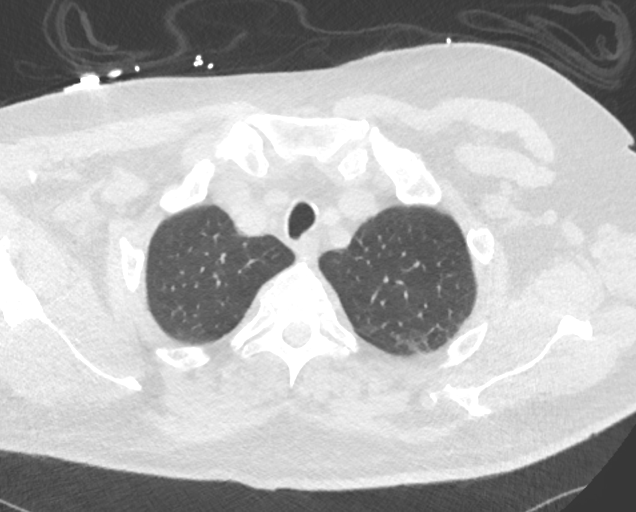
[im 419/446  lung]
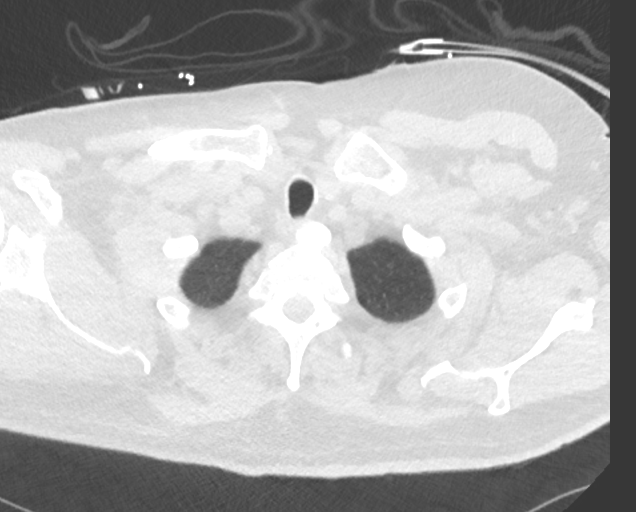

[12 of 37 positions shown; findings below may reference images not displayed]

FINDINGS: Cardiovascular: No filling defects within the pulmonary arterial
tree to suggest pulmonary embolism. Heart size is mildly enlarged.
There is no significant pericardial fluid, thickening or pericardial
calcification. There is aortic atherosclerosis, as well as
atherosclerosis of the great vessels of the mediastinum and the
coronary arteries, including calcified atherosclerotic plaque in the
left main, left anterior descending, left circumflex and right
coronary arteries. Thickening calcification of the aortic valve.

Mediastinum/Nodes: No pathologically enlarged mediastinal or hilar
lymph nodes. Esophagus is unremarkable in appearance. No axillary
lymphadenopathy.

Lungs/Pleura: High-resolution images demonstrate no significant
regions of ground-glass attenuation, septal thickening, subpleural
reticulation, parenchymal banding, traction bronchiectasis or frank
honeycombing. No acute consolidative airspace disease. Trace right
pleural effusion lying dependently. No left pleural effusion.
Inspiratory and expiratory imaging is unremarkable.

Upper Abdomen: Aortic atherosclerosis.

Musculoskeletal: There are no aggressive appearing lytic or blastic
lesions noted in the visualized portions of the skeleton.
IMPRESSION: 1. No evidence of pulmonary embolism.
2. No findings to suggest interstitial lung disease.
3. Trace right pleural effusion lying dependently.
4. Aortic atherosclerosis, in addition to left main and 3 vessel
coronary artery disease. Assessment for potential risk factor
modification, dietary therapy or pharmacologic therapy may be
warranted, if clinically indicated.
5. There are calcifications of the aortic valve. Echocardiographic
correlation for evaluation of potential valvular dysfunction may be
warranted if clinically indicated.

Aortic Atherosclerosis (6P0EI-TAV.V).
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
## 2022-11-25 NOTE — Discharge Summary (Signed)
 Physician Discharge Summary  Admit date: 11/21/2022  Discharge date: 11/25/2022   Discharge to: Home with self-care  Discharge Service: General Medicine (MED)  Discharge Attending Physician: Margart Elsie Dragon, DO  Discharge Diagnoses:  Principal Problem:   Cellulitis of great toe of right foot Active Problems:   Essential hypertension   Hypertension, accelerated, with diastolic congestive heart failure, NYHA class 1 (CMS-HCC)   Physical debility   Gout   AKI (acute kidney injury) (CMS-HCC)   Normocytic anemia    Procedures: I&D with excisional debridement-11/24/2022 MRI right lower extremity/foot ABIs Right foot x-ray  Consults:     General Surgery and Physical Therapy Evaluation and Treatment  Home Health/DME: None  Pertinent Test Results:  Lab work and imaging included below  Physical Exam: BP 171/71   Pulse 60   Temp 36.4 C (97.5 F) (Oral)   Resp 18   Ht 200.7 cm (6' 7)   Wt (!) 132.8 kg (292 lb 12.8 oz)   SpO2 96%   BMI 32.99 kg/m    Physical Exam Constitutional:      General: He is not in acute distress.    Appearance: He is ill-appearing. He is not toxic-appearing.  HENT:     Head: Normocephalic.     Mouth/Throat:     Mouth: Mucous membranes are moist.     Pharynx: Oropharynx is clear. No oropharyngeal exudate.  Eyes:     General: No scleral icterus.    Pupils: Pupils are equal, round, and reactive to light.  Cardiovascular:     Rate and Rhythm: Normal rate and regular rhythm.     Pulses: Normal pulses.     Heart sounds: Normal heart sounds. No murmur heard.    No friction rub. No gallop.  Pulmonary:     Effort: Pulmonary effort is normal. No respiratory distress.     Breath sounds: No wheezing, rhonchi or rales.  Abdominal:     General: Bowel sounds are normal. There is no distension.     Palpations: Abdomen is soft.     Tenderness: There is no abdominal tenderness. There is no guarding or rebound.  Musculoskeletal:         General: No swelling. Normal range of motion.     Right lower leg: No edema.     Left lower leg: No edema.  Skin:    Findings: Lesion (right foot in clean bandage, no strikethrough) present.  Neurological:     General: No focal deficit present.     Mental Status: He is alert and oriented to person, place, and time. Mental status is at baseline.     Motor: No weakness.  Psychiatric:        Mood and Affect: Mood normal.        Behavior: Behavior normal.        Thought Content: Thought content normal.      Hospital Course:   Tyler Macdonald is a 82 y.o. male that presented to [facility] and was admitted for Cellulitis of great toe of right foot on 11/21/2022  1:51 PM .  Briefly patient is a pleasant 82 year old male who was admitted on 11/21/2022 with findings concerning for right great toe osteomyelitis.  Patient had reportedly injured toe by backing up his motorized scooter over top of it.  He was originally placed on oral antibiotics through the [facility] however pain and erythema worsened and patient presented to the hospital for evaluation.  MRI confirmed distal right great toe osteomyelitis.  He was taken to the OR on 11/24/2022.  Cultures from presentation growing gram-negative rods and strep anginosus group.  After discussion with general surgery patient discharged home on oral Levaquin  for the remainder of 6 weeks.  He will follow-up in wound care 1 week after discharge.  He is to continue Monday Wednesday Friday dressing changes.  Patient discharged home in stable condition, offered home health however patient declined.  Strict return precautions discussed.  Issues addressed during current hospitalization:  Acute osteomyelitis Strep anginosus group and gram-negative rods on final culture Continue with oral Levaquin  for the remainder of 6-week therapy, follow-up in outpatient wound care clinic Continue with dressing changes per general surgery recs with Opticell AG 4x4s,  Kerlix, and ace, Monday Wednesday and Friday Continue vitamin supplementation Offload with orthopedic post op shoe Follow-up in wound clinic 1 week after discharge   2. Acute renal injury not likely present this admission Patient appears to have some element of CKD, stage II, baseline eGFR ~56 Mild increase in creatinine on admission does not meet KDIGO criteria for AKI, likely renal insufficiency due to mild dehydration. Avoid nephrotoxins   3. Hypertension with grade II diastolic HF Appears well compensated at this time Resume home medications   4. Age related physical debility Patient has motorized scooter at bedside Recommend home health at discharge, patient declined this at this time   I spent greater than 30 mins in the discharge of this patient.  Condition at Discharge: stable Discharge Medications:    Your Medication List     START taking these medications    ascorbic acid (vitamin C) 1000 MG tablet Commonly known as: VITAMIN C Take 1 tablet (1,000 mg total) by mouth two (2) times a day.   levoFLOXacin  750 MG tablet Commonly known as: LEVAQUIN  Take 1 tablet (750 mg total) by mouth daily.   zinc gluconate 50 mg (7 mg elemental zinc) tablet Take 1 tablet (50 mg total) by mouth daily. Start taking on: November 26, 2022       CONTINUE taking these medications    allopurinol  300 MG tablet Commonly known as: ZYLOPRIM  Take 1 tablet (300 mg total) by mouth daily.   amlodipine  10 MG tablet Commonly known as: NORVASC  Take 1 tablet (10 mg total) by mouth daily.   aspirin  81 MG tablet Commonly known as: ECOTRIN Take 1 tablet (81 mg total) by mouth daily.   colchicine  0.6 mg tablet Commonly known as: COLCRYS  Take 1 tablet (0.6 mg total) by mouth two (2) times a day.   diclofenac-met salicyl-menthol 03-10-08 % Cpcg Apply topically.   doxycycline  100 MG tablet Commonly known as: ADOXA Take 1 tablet (100 mg total) by mouth two (2) times a day.   fish  oil-omega-3 fatty acids 300-1,000 mg capsule Take 2 capsules (2 g total) by mouth daily.   fluticasone  propionate 50 mcg/actuation nasal spray Commonly known as: FLONASE  1 spray into each nostril daily as needed for rhinitis.   ipratropium-albuterol 20-100 mcg/actuation Mist inhaler Commonly known as: COMBIVENT Inhale 1 puff four (4) times a day as needed for wheezing.   ketotifen  0.025 % (0.035 %) ophthalmic solution Commonly known as: ZADITOR  Administer 1 drop to both eyes two (2) times a day.   lisinopril 20 MG tablet Commonly known as: PRINIVIL,ZESTRIL Take 1 tablet (20 mg total) by mouth daily.   metoPROLOL  succinate 50 MG 24 hr tablet Commonly known as: Toprol -XL Take 0.5 tablets (25 mg total) by mouth daily.   peg 400-propylene glycol (  PF) 0.4-0.3 % Drop Administer 1 drop to both eyes four (4) times a day.   sertraline  100 MG tablet Commonly known as: ZOLOFT  Take 1 tablet (100 mg total) by mouth daily.   VOLTAREN 1 % gel Generic drug: diclofenac sodium Apply 4 g topically four (4) times a day.        Labs: Blood Recent Labs    Units 11/21/22 1341 11/22/22 0502 11/23/22 0423 11/25/22 0539  WBC 10*9/L 8.1 8.2 7.8 7.3  HGB g/dL 88.0* 88.4* 88.5* 88.4*  HCT % 35.5* 35.2* 34.5* 34.2*  PLT 10*9/L 358 342 351 352   Recent Labs    Units 11/21/22 1341 11/21/22 1425 11/22/22 0502 11/23/22 0423 11/24/22 0323 11/25/22 0539  NA mmol/L 138  --  140 140  --  139  K mmol/L 3.9  --  4.0 3.8  --  4.0  CL mmol/L 103  --  105 104  --  104  CO2 mmol/L 30.9  --  29.9 28.6  --  27.3  BUN mg/dL 24*  --  21* 19  --  20  CREATININE mg/dL 8.45*  --  8.66* 8.66* 1.31* 1.26  GLU mg/dL 843  --  899 899  --  897  CALCIUM  mg/dL 9.1  --  9.1 8.8  --  8.7  ALBUMIN  g/dL 2.5*  --  2.6* 2.4*  --   --   PROT g/dL 7.6  --  7.4 7.4  --   --   BILITOT mg/dL 0.5  --  0.4 0.5  --   --   AST U/L 15  --  12* 8*  --   --   ALT U/L 17  --  17 16  --   --   ALKPHOS U/L 109  --  101 102   --   --   LACTATE mmol/L  --  1.6  --   --   --   --    No results for input(s): CKTOTAL, TROPONINI, TROPONINT, CKMB, EDTPNI, PROBNP, CHOL, LDL, HDL, TRIG in the last 168 hours. No results for input(s): INR, LABPROT, APTT, DDIMER in the last 168 hours. Recent Labs    Units 11/21/22 1341 11/23/22 0423  A1C %  --  5.5  ESR mm/h 53*  --   CRP mg/L 132.0*  --   No results for input(s): HEPAIGM, HEPBSAG, HEPBIGM, HEPCAB, MITOAB in the last 168 hours. Urine No results for input(s): WBCUA, NITRITE, LEUKOCYTESUR, BACTERIA, RBCUA, BLOODU, GLUCOSEU, PROTEINUA, KETONESU in the last 168 hours. No results found for: METH, OPIACU, [id number], AMPM, [id number], ORM27164, ORM27156, OXYCO  Body Fluids No results for input(s): FTYP1, WBCFLUID, FNEUT, LYMPHSFL, FMONO, EOSFL, RBCFL, CLARITYFLUID, COLORFL in the last 168 hours. ABG No results for input(s): O2SOUR, FIO2ART, PHART, PCO2ART, PO2ART, HCO3ART, O2SATART, BEART in the last 72 hours. Microbiology Results (last day)     Procedure Component Value Date/Time Date/Time   Aerobic/Anaerobic Culture [[id number]] Collected: 11/24/22 0840   Lab Status: Preliminary result Specimen: Swab, Intraoperative Collection from Toe, Right Updated: 11/25/22 1301    Aerobic/Anaerobic Culture CULTURE IN PROGRESS.    Gram Stain Result 3+ Polymorphonuclear leukocytes     3+ Gram positive cocci   Narrative:     Specimen Source: Toe, Right OR Specimen Description: Right great toe. Deep.   Aerobic/Anaerobic Culture [[id number]]  (Abnormal) Collected: 11/22/22 1045   Lab Status: Preliminary result Specimen: Swab, Intraoperative Collection from Toe, Right Updated: 11/25/22 0911    Aerobic/Anaerobic Culture MDR SCREEN:  This culture was screened and determined to be negative for multi-drug resistant Enterobacterales.     3+ Mixed Gram Positive/Gram Negative Organisms  Isolated*     3+ Streptococcus anginosus group*    Comment: S. anginosus group organisms are predictably susceptible to penicillin.      Gram Stain Result 1+ Polymorphonuclear leukocytes     3+ Gram positive cocci     2+ Gram negative rods (bacilli)   Narrative:     Specimen Source: Toe, Right       Radiology: MRI Lower Extremity Non-Joint Right W Wo Contrast  Result Date: 11/23/2022 CLINICAL DATA:  Right great toe infection.  Cellulitis EXAM: MRI OF LOWER RIGHT EXTREMITY WITHOUT AND WITH CONTRAST TECHNIQUE: Multiplanar, multisequence MR imaging of the right toes was performed both before and after administration of intravenous contrast. CONTRAST:  10 mL Gadavist IV contrast COMPARISON:  X-ray 11/21/2022 FINDINGS: Bones/Joint/Cartilage Bone marrow edema and enhancement throughout the distal phalanx of the right great toe with confluent low T1 marrow signal changes compatible with acute osteomyelitis. Trace effusion of the great toe IP joint and first MTP joints, nonspecific. Preservation of the bone marrow signal within the proximal phalanx of the great toe. No additional sites of bone marrow edema or marrow replacement. No acute fracture or dislocation. Ligaments Intact collateral ligaments. Muscles and Tendons Fatty atrophy of the foot musculature.  No tenosynovitis. Soft tissues Soft tissue swelling of the great toe with rim enhancing fluid collection in the plantar soft tissues underlying the distal phalanx measuring 13 x 12 x 13 mm.   1. Acute osteomyelitis of the distal phalanx of the right great toe. 2. Soft tissue swelling of the great toe with rim enhancing abscess in the plantar soft tissues underlying the distal phalanx measuring 13 mm. 3. Trace effusions of the great toe IP joint and first MTP joints, nonspecific. Electronically Signed   By: Mabel Converse D.O.   On: 11/23/2022 20:00   PVL ABI  Result Date: 11/23/2022 CLINICAL DATA:  82 year old male with a history of diabetic  foot ulcer EXAM: NONINVASIVE PHYSIOLOGIC VASCULAR STUDY OF BILATERAL LOWER EXTREMITIES TECHNIQUE: Evaluation of both lower extremities was performed at rest, including calculation of ankle-brachial indices, multiple segmental pressure evaluation, segmental Doppler and segmental pulse volume recording. COMPARISON:  None Available. FINDINGS: Right ABI:  1.06 Left ABI:  1.15 Right Lower Extremity: Segmental Doppler at the right ankle demonstrates monophasic waveform of the posterior tibial artery and triphasic waveform of the dorsalis pedis. Segmental PVR maintained at the great toe Left Lower Extremity: Segmental Doppler at the left ankle demonstrates triphasic waveform of the posterior tibial artery and dorsalis pedis. PVR at the great toe is near flat line.   Resting ABI of the bilateral lower extremity within normal limits. Segmental exam at the left ankle demonstrates waveforms maintained. Evidence of developing left-sided pedal disease/small vessel disease. Segmental exam at the right ankle demonstrates evidence of tibial disease in the distribution of the posterior tibial artery Signed, Ami RAMAN. Alona ROSALEA GRAVER, RPVI [facility] Radiology Electronically Signed   By: Ami Alona D.O.   On: 11/23/2022 15:13   XR Foot 3 Or More Views Right  Result Date: 11/21/2022 CLINICAL DATA:  Cellulitis. Wheelchair ran over great toe last week. Swelling EXAM: RIGHT FOOT COMPLETE - 3+ VIEW COMPARISON:  None Available. FINDINGS: Extensive soft tissue swelling is present over the dorsum of the foot. No acute or focal osseous abnormalities are present. A chronic avulsion fracture  at the base of the fifth metatarsal is corticated. Prominent calcaneal spurs are present. Additional degenerative changes are present in the midfoot.   1. Extensive soft tissue swelling over the dorsum of the foot without underlying fracture. 2. Chronic avulsion fracture at the base of the fifth  metatarsal. Electronically Signed   By: Lonni Necessary M.D.   On: 11/21/2022 15:15    Discharge Instructions:  Activity Instructions     Activity as tolerated        Diet Instructions     Discharge diet (specify)     Discharge Nutrition Therapy: Heart Healthy      Other Instructions     Call MD for:  difficulty breathing, headache or visual disturbances     Call MD for:  extreme fatigue     Call MD for:  hives     Call MD for:  persistent dizziness or light-headedness     Call MD for:  persistent nausea or vomiting     Call MD for:  redness, tenderness, or signs of infection (pain, swelling, redness, odor or green/yellow discharge around incision site)     Call MD for:  severe uncontrolled pain     Call MD for: Temperature > 38.5 Celsius ( > 101.3 Fahrenheit)     Discharge instructions     Get plenty of rest, Stay hydrated, return to ER if symptoms worsen.  Please continue the oral antibiotic for the next several weeks.  Please continue with dressing changes every Monday Wednesday and Friday as directed by general surgery.  It has been a pleasure caring for you in the hospital!  Please return to the ED with any new or worsening symptoms.     Follow Up instructions and Outpatient Referrals    Ambulatory Referral to Home Health     Reason for referral: Cellulitis of Right Great toe   Disciplines requested:  Nursing Physical Therapy Occupational Therapy     Nursing requested: Teaching/skilled observation and assessment   What teaching is needed (new diagnosis? new medications?): Teach signs  and symptoms of worsening infection; Teach dressing changes with zeroform  to right great toe and cover with 4x4s, Kerlix and ACE Wrap   Physical Therapy requested:  Home safety evaluation Ambulation training Transfer training Evaluate and treat Strengthening exercises Weight bearing status     Weight Bearing Status (please provide detail): Shoe to offload   Occupational  Therapy Requested:  Home safety evaluation Transfer training ADL or IADL training Cognitive training Evaluate and treat Strengthening exercises     Physician to follow patient's care: [facility]   Ambulatory Referral to Wound Clinic     Reason for referral: Cellulitis of right great toe   Call MD for:  difficulty breathing, headache or visual disturbances     Call MD for:  extreme fatigue     Call MD for:  hives     Call MD for:  persistent dizziness or light-headedness     Call MD for:  persistent nausea or vomiting     Call MD for:  redness, tenderness, or signs of infection (pain, swelling,  redness, odor or green/yellow discharge around incision site)     Call MD for:  severe uncontrolled pain     Call MD for: Temperature > 38.5 Celsius ( > 101.3 Fahrenheit)     Discharge instructions      Appointments which have been scheduled for you    Dec 11, 2022 8:30 AM (Arrive by 8:15 AM) NEW WOUND with [place] Sergio Bolus, MD [facility]) [address] [place] [address] [phone]    Additional instructions:   The WOUND CENTER IS LOCATED AT THE DAY SURGERY ENTRANCE OF THE HOSPITAL.       Margart Dragon, DO *Some images could not be shown.

## 2024-03-08 ENCOUNTER — Inpatient Hospital Stay (HOSPITAL_COMMUNITY): Admitting: Anesthesiology

## 2024-03-08 ENCOUNTER — Encounter (HOSPITAL_COMMUNITY): Payer: Self-pay | Admitting: Family Medicine

## 2024-03-08 ENCOUNTER — Inpatient Hospital Stay (HOSPITAL_COMMUNITY)

## 2024-03-08 ENCOUNTER — Emergency Department (HOSPITAL_COMMUNITY)

## 2024-03-08 ENCOUNTER — Encounter (HOSPITAL_COMMUNITY)
Admission: EM | Disposition: A | Discharge status: Skilled Nursing Facility | Payer: Self-pay | Source: Home / Self Care | Attending: Internal Medicine

## 2024-03-08 ENCOUNTER — Inpatient Hospital Stay (HOSPITAL_COMMUNITY)
Admission: EM | Admit: 2024-03-08 | Discharge: 2024-03-14 | DRG: 481 | Disposition: A | Discharge status: Skilled Nursing Facility | Attending: Internal Medicine | Admitting: Internal Medicine

## 2024-03-08 ENCOUNTER — Other Ambulatory Visit: Payer: Self-pay

## 2024-03-08 DIAGNOSIS — I5030 Unspecified diastolic (congestive) heart failure: Secondary | ICD-10-CM

## 2024-03-08 DIAGNOSIS — I10 Essential (primary) hypertension: Secondary | ICD-10-CM | POA: Diagnosis present

## 2024-03-08 DIAGNOSIS — M545 Low back pain, unspecified: Secondary | ICD-10-CM | POA: Diagnosis present

## 2024-03-08 DIAGNOSIS — S0990XA Unspecified injury of head, initial encounter: Secondary | ICD-10-CM | POA: Diagnosis present

## 2024-03-08 DIAGNOSIS — E875 Hyperkalemia: Secondary | ICD-10-CM | POA: Diagnosis present

## 2024-03-08 DIAGNOSIS — D62 Acute posthemorrhagic anemia: Secondary | ICD-10-CM | POA: Diagnosis not present

## 2024-03-08 DIAGNOSIS — I110 Hypertensive heart disease with heart failure: Secondary | ICD-10-CM | POA: Diagnosis not present

## 2024-03-08 DIAGNOSIS — Z8673 Personal history of transient ischemic attack (TIA), and cerebral infarction without residual deficits: Secondary | ICD-10-CM

## 2024-03-08 DIAGNOSIS — Z7409 Other reduced mobility: Secondary | ICD-10-CM | POA: Diagnosis present

## 2024-03-08 DIAGNOSIS — M109 Gout, unspecified: Secondary | ICD-10-CM | POA: Diagnosis present

## 2024-03-08 DIAGNOSIS — Z7982 Long term (current) use of aspirin: Secondary | ICD-10-CM

## 2024-03-08 DIAGNOSIS — Z79899 Other long term (current) drug therapy: Secondary | ICD-10-CM

## 2024-03-08 DIAGNOSIS — F32A Depression, unspecified: Secondary | ICD-10-CM | POA: Diagnosis present

## 2024-03-08 DIAGNOSIS — S72111A Displaced fracture of greater trochanter of right femur, initial encounter for closed fracture: Secondary | ICD-10-CM | POA: Diagnosis present

## 2024-03-08 DIAGNOSIS — I2510 Atherosclerotic heart disease of native coronary artery without angina pectoris: Secondary | ICD-10-CM

## 2024-03-08 DIAGNOSIS — E7800 Pure hypercholesterolemia, unspecified: Secondary | ICD-10-CM | POA: Diagnosis present

## 2024-03-08 DIAGNOSIS — S72141A Displaced intertrochanteric fracture of right femur, initial encounter for closed fracture: Secondary | ICD-10-CM

## 2024-03-08 DIAGNOSIS — Z881 Allergy status to other antibiotic agents status: Secondary | ICD-10-CM

## 2024-03-08 DIAGNOSIS — Z87891 Personal history of nicotine dependence: Secondary | ICD-10-CM

## 2024-03-08 DIAGNOSIS — Z8249 Family history of ischemic heart disease and other diseases of the circulatory system: Secondary | ICD-10-CM

## 2024-03-08 DIAGNOSIS — I129 Hypertensive chronic kidney disease with stage 1 through stage 4 chronic kidney disease, or unspecified chronic kidney disease: Secondary | ICD-10-CM | POA: Diagnosis present

## 2024-03-08 DIAGNOSIS — E669 Obesity, unspecified: Secondary | ICD-10-CM | POA: Diagnosis present

## 2024-03-08 DIAGNOSIS — S72101A Unspecified trochanteric fracture of right femur, initial encounter for closed fracture: Principal | ICD-10-CM

## 2024-03-08 DIAGNOSIS — S72121A Displaced fracture of lesser trochanter of right femur, initial encounter for closed fracture: Secondary | ICD-10-CM | POA: Diagnosis present

## 2024-03-08 DIAGNOSIS — N179 Acute kidney failure, unspecified: Secondary | ICD-10-CM | POA: Diagnosis present

## 2024-03-08 DIAGNOSIS — Z880 Allergy status to penicillin: Secondary | ICD-10-CM

## 2024-03-08 DIAGNOSIS — Z6830 Body mass index (BMI) 30.0-30.9, adult: Secondary | ICD-10-CM

## 2024-03-08 DIAGNOSIS — Z7901 Long term (current) use of anticoagulants: Secondary | ICD-10-CM

## 2024-03-08 DIAGNOSIS — R110 Nausea: Secondary | ICD-10-CM | POA: Diagnosis present

## 2024-03-08 DIAGNOSIS — S72001A Fracture of unspecified part of neck of right femur, initial encounter for closed fracture: Secondary | ICD-10-CM | POA: Diagnosis present

## 2024-03-08 DIAGNOSIS — W109XXA Fall (on) (from) unspecified stairs and steps, initial encounter: Secondary | ICD-10-CM | POA: Diagnosis present

## 2024-03-08 DIAGNOSIS — F419 Anxiety disorder, unspecified: Secondary | ICD-10-CM | POA: Diagnosis present

## 2024-03-08 DIAGNOSIS — N1831 Chronic kidney disease, stage 3a: Secondary | ICD-10-CM | POA: Diagnosis present

## 2024-03-08 LAB — CBC
HCT: 38.5 % — ABNORMAL LOW (ref 39.0–52.0)
HCT: 33.8 % — ABNORMAL LOW (ref 39.0–52.0)
Hemoglobin: 12.7 g/dL — ABNORMAL LOW (ref 13.0–17.0)
Hemoglobin: 11.2 g/dL — ABNORMAL LOW (ref 13.0–17.0)
MCH: 31.4 pg (ref 26.0–34.0)
MCH: 31.5 pg (ref 26.0–34.0)
MCHC: 33 g/dL (ref 30.0–36.0)
MCHC: 33.1 g/dL (ref 30.0–36.0)
MCV: 95.3 fL (ref 80.0–100.0)
MCV: 94.9 fL (ref 80.0–100.0)
Platelets: 295 10*3/uL (ref 150–400)
Platelets: 294 10*3/uL (ref 150–400)
RBC: 4.04 MIL/uL — ABNORMAL LOW (ref 4.22–5.81)
RBC: 3.56 MIL/uL — ABNORMAL LOW (ref 4.22–5.81)
RDW: 13.4 % (ref 11.5–15.5)
RDW: 13.4 % (ref 11.5–15.5)
WBC: 14.2 10*3/uL — ABNORMAL HIGH (ref 4.0–10.5)
WBC: 8.5 10*3/uL (ref 4.0–10.5)
nRBC: 0 % (ref 0.0–0.2)
nRBC: 0 % (ref 0.0–0.2)

## 2024-03-08 LAB — COMPREHENSIVE METABOLIC PANEL WITH GFR
ALT: 7 U/L (ref 0–44)
AST: 17 U/L (ref 15–41)
Albumin: 3.6 g/dL (ref 3.5–5.0)
Alkaline Phosphatase: 128 U/L — ABNORMAL HIGH (ref 38–126)
Anion gap: 11 (ref 5–15)
BUN: 17 mg/dL (ref 8–23)
CO2: 24 mmol/L (ref 22–32)
Calcium: 9.4 mg/dL (ref 8.9–10.3)
Chloride: 102 mmol/L (ref 98–111)
Creatinine, Ser: 1.27 mg/dL — ABNORMAL HIGH (ref 0.61–1.24)
GFR, Estimated: 57 mL/min — ABNORMAL LOW
Glucose, Bld: 100 mg/dL — ABNORMAL HIGH (ref 70–99)
Potassium: 4.5 mmol/L (ref 3.5–5.1)
Sodium: 137 mmol/L (ref 135–145)
Total Bilirubin: 0.6 mg/dL (ref 0.0–1.2)
Total Protein: 7.4 g/dL (ref 6.5–8.1)

## 2024-03-08 LAB — CREATININE, SERUM
Creatinine, Ser: 1.75 mg/dL — ABNORMAL HIGH (ref 0.61–1.24)
GFR, Estimated: 39 mL/min — ABNORMAL LOW

## 2024-03-08 LAB — SURGICAL PCR SCREEN
MRSA, PCR: POSITIVE — AB
Staphylococcus aureus: POSITIVE — AB

## 2024-03-08 MED ORDER — OXYCODONE HCL 5 MG PO TABS: 5.0000 mg | ORAL_TABLET | ORAL | Status: DC | PRN

## 2024-03-08 MED ORDER — VASOPRESSIN 20 UNIT/ML IV SOLN
INTRAVENOUS | Status: AC
  Filled 2024-03-08: qty 1

## 2024-03-08 MED ORDER — ONDANSETRON HCL 4 MG/2ML IJ SOLN
INTRAMUSCULAR | Status: DC | PRN
  Administered 2024-03-08: 4 mg via INTRAVENOUS

## 2024-03-08 MED ORDER — OXYCODONE HCL 5 MG PO TABS
2.5000 mg | ORAL_TABLET | ORAL | Status: DC | PRN
  Filled 2024-03-08: qty 1

## 2024-03-08 MED ORDER — COLCHICINE 0.6 MG PO TABS: 0.6000 mg | ORAL_TABLET | Freq: Two times a day (BID) | ORAL | Status: DC

## 2024-03-08 MED ORDER — FENTANYL CITRATE (PF) 100 MCG/2ML IJ SOLN
INTRAMUSCULAR | Status: AC
  Filled 2024-03-08: qty 2

## 2024-03-08 MED ORDER — ONDANSETRON HCL 4 MG/2ML IJ SOLN
4.0000 mg | Freq: Once | INTRAMUSCULAR | Status: AC
  Administered 2024-03-08: 4 mg via INTRAVENOUS
  Filled 2024-03-08: qty 2

## 2024-03-08 MED ORDER — ONDANSETRON HCL 4 MG/2ML IJ SOLN: 4.0000 mg | Freq: Once | INTRAMUSCULAR | Status: DC | PRN

## 2024-03-08 MED ORDER — SODIUM CHLORIDE (PF) 0.9 % IJ SOLN
INTRAMUSCULAR | Status: AC
  Filled 2024-03-08: qty 20

## 2024-03-08 MED ORDER — ONDANSETRON HCL 4 MG/2ML IJ SOLN
INTRAMUSCULAR | Status: AC
  Filled 2024-03-08: qty 2

## 2024-03-08 MED ORDER — ENOXAPARIN SODIUM 40 MG/0.4ML IJ SOSY: 40.0000 mg | PREFILLED_SYRINGE | INTRAMUSCULAR | Status: DC

## 2024-03-08 MED ORDER — POLYETHYLENE GLYCOL 3350 17 G PO PACK: 17.0000 g | PACK | Freq: Every day | ORAL | Status: DC | PRN

## 2024-03-08 MED ORDER — CHLORHEXIDINE GLUCONATE CLOTH 2 % EX PADS
6.0000 | MEDICATED_PAD | Freq: Every day | CUTANEOUS | Status: AC
  Administered 2024-03-09 – 2024-03-13 (×5): 6 via TOPICAL

## 2024-03-08 MED ORDER — PHENYLEPHRINE 80 MCG/ML (10ML) SYRINGE FOR IV PUSH (FOR BLOOD PRESSURE SUPPORT)
PREFILLED_SYRINGE | INTRAVENOUS | Status: DC | PRN
  Administered 2024-03-08 (×3): 160 ug via INTRAVENOUS

## 2024-03-08 MED ORDER — DEXAMETHASONE SOD PHOSPHATE PF 10 MG/ML IJ SOLN
INTRAMUSCULAR | Status: DC | PRN
  Administered 2024-03-08: 10 mg via INTRAVENOUS

## 2024-03-08 MED ORDER — AMLODIPINE BESYLATE 10 MG PO TABS: 10.0000 mg | ORAL_TABLET | Freq: Every day | ORAL | Status: DC

## 2024-03-08 MED ORDER — VANCOMYCIN HCL 1000 MG IV SOLR
INTRAVENOUS | Status: DC | PRN
  Administered 2024-03-08: 1000 mg via TOPICAL

## 2024-03-08 MED ORDER — VANCOMYCIN HCL 1000 MG IV SOLR
INTRAVENOUS | Status: AC
  Filled 2024-03-08: qty 20

## 2024-03-08 MED ORDER — DOCUSATE SODIUM 100 MG PO CAPS
100.0000 mg | ORAL_CAPSULE | Freq: Two times a day (BID) | ORAL | Status: DC
  Administered 2024-03-08: 100 mg via ORAL
  Filled 2024-03-08: qty 1

## 2024-03-08 MED ORDER — PHENYLEPHRINE HCL-NACL 20-0.9 MG/250ML-% IV SOLN
INTRAVENOUS | Status: DC | PRN
  Administered 2024-03-08: 40 ug/min via INTRAVENOUS

## 2024-03-08 MED ORDER — HYDRALAZINE HCL 25 MG PO TABS: 25.0000 mg | ORAL_TABLET | Freq: Four times a day (QID) | ORAL | Status: DC | PRN

## 2024-03-08 MED ORDER — PROCHLORPERAZINE EDISYLATE 10 MG/2ML IJ SOLN: 5.0000 mg | Freq: Four times a day (QID) | INTRAMUSCULAR | Status: DC | PRN

## 2024-03-08 MED ORDER — VANCOMYCIN HCL IN DEXTROSE 1-5 GM/200ML-% IV SOLN
1000.0000 mg | Freq: Two times a day (BID) | INTRAVENOUS | Status: AC
  Administered 2024-03-08: 1000 mg via INTRAVENOUS
  Filled 2024-03-08: qty 200

## 2024-03-08 MED ORDER — PHENYLEPHRINE 80 MCG/ML (10ML) SYRINGE FOR IV PUSH (FOR BLOOD PRESSURE SUPPORT)
PREFILLED_SYRINGE | INTRAVENOUS | Status: AC
  Filled 2024-03-08: qty 10

## 2024-03-08 MED ORDER — SERTRALINE HCL 25 MG PO TABS
12.5000 mg | ORAL_TABLET | Freq: Every day | ORAL | Status: DC
  Administered 2024-03-08 – 2024-03-10 (×3): 12.5 mg via ORAL
  Filled 2024-03-08 (×3): qty 1

## 2024-03-08 MED ORDER — JUVEN PO PACK
1.0000 | PACK | Freq: Two times a day (BID) | ORAL | Status: DC
  Administered 2024-03-08 – 2024-03-14 (×12): 1 via ORAL
  Filled 2024-03-08 (×12): qty 1

## 2024-03-08 MED ORDER — ROCURONIUM BROMIDE 10 MG/ML (PF) SYRINGE
PREFILLED_SYRINGE | INTRAVENOUS | Status: AC
  Filled 2024-03-08: qty 10

## 2024-03-08 MED ORDER — ORAL CARE MOUTH RINSE: 15.0000 mL | Freq: Once | OROMUCOSAL | Status: AC

## 2024-03-08 MED ORDER — KETOTIFEN FUMARATE 0.035 % OP SOLN: 1.0000 [drp] | Freq: Two times a day (BID) | OPHTHALMIC | Status: DC

## 2024-03-08 MED ORDER — EPHEDRINE SULFATE-NACL 50-0.9 MG/10ML-% IV SOSY
PREFILLED_SYRINGE | INTRAVENOUS | Status: DC | PRN
  Administered 2024-03-08 (×2): 5 mg via INTRAVENOUS

## 2024-03-08 MED ORDER — PROPOFOL 10 MG/ML IV BOLUS
INTRAVENOUS | Status: DC | PRN
  Administered 2024-03-08: 100 mg via INTRAVENOUS

## 2024-03-08 MED ORDER — ROSUVASTATIN CALCIUM 20 MG PO TABS: 40.0000 mg | ORAL_TABLET | Freq: Every day | ORAL | Status: DC

## 2024-03-08 MED ORDER — ACETAMINOPHEN 10 MG/ML IV SOLN: 1000.0000 mg | Freq: Once | INTRAVENOUS | Status: DC | PRN

## 2024-03-08 MED ORDER — ROCURONIUM BROMIDE 10 MG/ML (PF) SYRINGE
PREFILLED_SYRINGE | INTRAVENOUS | Status: DC | PRN
  Administered 2024-03-08: 50 mg via INTRAVENOUS

## 2024-03-08 MED ORDER — FENTANYL CITRATE (PF) 50 MCG/ML IJ SOSY: 25.0000 ug | PREFILLED_SYRINGE | INTRAMUSCULAR | Status: DC | PRN

## 2024-03-08 MED ORDER — POLYETHYLENE GLYCOL 3350 17 G PO PACK
17.0000 g | PACK | Freq: Two times a day (BID) | ORAL | Status: DC
  Administered 2024-03-08: 17 g via ORAL
  Filled 2024-03-08 (×2): qty 1

## 2024-03-08 MED ORDER — METHOCARBAMOL 1000 MG/10ML IJ SOLN: 500.0000 mg | Freq: Three times a day (TID) | INTRAMUSCULAR | Status: DC | PRN

## 2024-03-08 MED ORDER — MUPIROCIN 2 % EX OINT
1.0000 | TOPICAL_OINTMENT | Freq: Two times a day (BID) | CUTANEOUS | Status: AC
  Administered 2024-03-08 – 2024-03-13 (×9): 1 via NASAL
  Filled 2024-03-08 (×2): qty 22

## 2024-03-08 MED ORDER — SUGAMMADEX SODIUM 200 MG/2ML IV SOLN
INTRAVENOUS | Status: DC | PRN
  Administered 2024-03-08: 300 mg via INTRAVENOUS

## 2024-03-08 MED ORDER — EPHEDRINE 5 MG/ML INJ
INTRAVENOUS | Status: AC
  Filled 2024-03-08: qty 5

## 2024-03-08 MED ORDER — ACETAMINOPHEN 325 MG PO TABS
650.0000 mg | ORAL_TABLET | Freq: Four times a day (QID) | ORAL | Status: DC | PRN
  Administered 2024-03-08: 650 mg via ORAL
  Filled 2024-03-08: qty 2

## 2024-03-08 MED ORDER — FENTANYL CITRATE (PF) 250 MCG/5ML IJ SOLN: INTRAMUSCULAR | Status: DC | PRN

## 2024-03-08 MED ORDER — ASPIRIN 81 MG PO TBEC
81.0000 mg | DELAYED_RELEASE_TABLET | Freq: Every day | ORAL | Status: DC
  Administered 2024-03-08 – 2024-03-14 (×7): 81 mg via ORAL
  Filled 2024-03-08 (×7): qty 1

## 2024-03-08 MED ORDER — AMISULPRIDE (ANTIEMETIC) 5 MG/2ML IV SOLN: 10.0000 mg | Freq: Once | INTRAVENOUS | Status: DC | PRN

## 2024-03-08 MED ORDER — CHLORHEXIDINE GLUCONATE 0.12 % MT SOLN
15.0000 mL | Freq: Once | OROMUCOSAL | Status: AC
  Administered 2024-03-08: 15 mL via OROMUCOSAL
  Filled 2024-03-08: qty 15

## 2024-03-08 MED ORDER — VANCOMYCIN HCL 1500 MG/300ML IV SOLN
1500.0000 mg | INTRAVENOUS | Status: AC
  Administered 2024-03-08: 1500 mg via INTRAVENOUS
  Filled 2024-03-08: qty 300

## 2024-03-08 MED ORDER — MORPHINE SULFATE (PF) 4 MG/ML IV SOLN
4.0000 mg | Freq: Once | INTRAVENOUS | Status: AC
  Administered 2024-03-08: 4 mg via INTRAVENOUS
  Filled 2024-03-08: qty 1

## 2024-03-08 MED ORDER — IOHEXOL 350 MG/ML SOLN
75.0000 mL | Freq: Once | INTRAVENOUS | Status: AC | PRN
  Administered 2024-03-08: 75 mL via INTRAVENOUS

## 2024-03-08 MED ORDER — FENTANYL CITRATE (PF) 100 MCG/2ML IJ SOLN
INTRAMUSCULAR | Status: DC | PRN
  Administered 2024-03-08: 100 ug via INTRAVENOUS

## 2024-03-08 MED ORDER — LEVOFLOXACIN IN D5W 500 MG/100ML IV SOLN
500.0000 mg | INTRAVENOUS | Status: AC
  Administered 2024-03-08: 500 mg via INTRAVENOUS
  Filled 2024-03-08: qty 100

## 2024-03-08 MED ORDER — LACTATED RINGERS IV SOLN: INTRAVENOUS | Status: DC

## 2024-03-08 MED ORDER — 0.9 % SODIUM CHLORIDE (POUR BTL) OPTIME
TOPICAL | Status: DC | PRN
  Administered 2024-03-08: 1000 mL

## 2024-03-08 MED ORDER — LIDOCAINE 2% (20 MG/ML) 5 ML SYRINGE
INTRAMUSCULAR | Status: AC
  Filled 2024-03-08: qty 5

## 2024-03-08 MED ORDER — DIPHENHYDRAMINE HCL 12.5 MG/5ML PO ELIX: 12.5000 mg | ORAL_SOLUTION | ORAL | Status: DC | PRN

## 2024-03-08 MED ORDER — ALLOPURINOL 300 MG PO TABS
300.0000 mg | ORAL_TABLET | Freq: Every day | ORAL | Status: DC
  Administered 2024-03-08 – 2024-03-14 (×7): 300 mg via ORAL
  Filled 2024-03-08 (×7): qty 1

## 2024-03-08 MED ORDER — TRANEXAMIC ACID-NACL 1000-0.7 MG/100ML-% IV SOLN
1000.0000 mg | Freq: Once | INTRAVENOUS | Status: AC
  Administered 2024-03-08: 1000 mg via INTRAVENOUS
  Filled 2024-03-08: qty 100

## 2024-03-08 MED ORDER — POVIDONE-IODINE 10 % EX SWAB
2.0000 | Freq: Once | CUTANEOUS | Status: AC
  Administered 2024-03-08: 2 via TOPICAL

## 2024-03-08 MED ORDER — ALBUMIN HUMAN 5 % IV SOLN: INTRAVENOUS | Status: DC | PRN

## 2024-03-08 MED ORDER — FENTANYL CITRATE (PF) 100 MCG/2ML IJ SOLN: 25.0000 ug | INTRAMUSCULAR | Status: DC | PRN

## 2024-03-08 MED ORDER — CHLORHEXIDINE GLUCONATE 4 % EX SOLN: 60.0000 mL | Freq: Once | CUTANEOUS | Status: DC

## 2024-03-08 MED ORDER — METOPROLOL SUCCINATE ER 25 MG PO TB24
25.0000 mg | ORAL_TABLET | Freq: Every day | ORAL | Status: DC
  Administered 2024-03-08 – 2024-03-14 (×7): 25 mg via ORAL
  Filled 2024-03-08 (×7): qty 1

## 2024-03-08 MED ORDER — METHOCARBAMOL 500 MG PO TABS
500.0000 mg | ORAL_TABLET | Freq: Three times a day (TID) | ORAL | Status: DC | PRN
  Administered 2024-03-08 – 2024-03-11 (×2): 500 mg via ORAL
  Filled 2024-03-08 (×2): qty 1

## 2024-03-08 MED ORDER — LIDOCAINE 2% (20 MG/ML) 5 ML SYRINGE
INTRAMUSCULAR | Status: DC | PRN
  Administered 2024-03-08: 60 mg via INTRAVENOUS

## 2024-03-08 MED ORDER — FLUTICASONE PROPIONATE 50 MCG/ACT NA SUSP: 1.0000 | Freq: Every day | NASAL | Status: DC | PRN

## 2024-03-08 MED ORDER — DEXAMETHASONE SOD PHOSPHATE PF 10 MG/ML IJ SOLN
INTRAMUSCULAR | Status: AC
  Filled 2024-03-08: qty 1

## 2024-03-08 NOTE — ED Notes (Signed)
 Report given to gretta RN of short stay.

## 2024-03-08 NOTE — Consult Note (Signed)
 Reason for Consult:Right hip fx Referring Physician: Adriana Grams Time called: [id number] Time at bedside: 0904   Tyler Macdonald is an 84 y.o. male.  HPI: Earley was coming down his stairs when his right knee gave out and he fell. He had immediate right hip pain and could not get up. He was brought to the ED where x-rays showed a right hip fx and orthopedic surgery was consulted. He lives at home alone and uses some crutches to help him ambulate.  Past Medical History:  Diagnosis Date   Gout    High cholesterol    Hypertension     Past Surgical History:  Procedure Laterality Date   KNEE SURGERY Right    SHOULDER SURGERY      No family history on file.  Social History:  reports that he has never smoked. He does not have any smokeless tobacco history on file. He reports that he does not drink alcohol and does not use drugs.  Allergies: Allergies[1]  Medications: I have reviewed the patient's current medications.  Results for orders placed or performed during the hospital encounter of 03/08/24 (from the past 48 hours)  CBC     Status: Abnormal   Collection Time: 03/08/24  3:10 AM  Result Value Ref Range   WBC 14.2 (H) 4.0 - 10.5 K/uL   RBC 4.04 (L) 4.22 - 5.81 MIL/uL   Hemoglobin 12.7 (L) 13.0 - 17.0 g/dL   HCT 61.4 (L) 60.9 - 47.9 %   MCV 95.3 80.0 - 100.0 fL   MCH 31.4 26.0 - 34.0 pg   MCHC 33.0 30.0 - 36.0 g/dL   RDW 86.5 88.4 - 84.4 %   Platelets 295 150 - 400 K/uL   nRBC 0.0 0.0 - 0.2 %    Comment: Performed at [facility], [address] [place], [place] [address]  Comprehensive metabolic panel with GFR     Status: Abnormal   Collection Time: 03/08/24  3:10 AM  Result Value Ref Range   Sodium 137 135 - 145 mmol/L   Potassium 4.5 3.5 - 5.1 mmol/L   Chloride 102 98 - 111 mmol/L   CO2 24 22 - 32 mmol/L   Glucose, Bld 100 (H) 70 - 99 mg/dL    Comment: Glucose reference range applies only to samples taken after fasting for at least 8 hours.   BUN 17 8 - 23  mg/dL   Creatinine, Ser 8.72 (H) 0.61 - 1.24 mg/dL   Calcium  9.4 8.9 - 10.3 mg/dL   Total Protein 7.4 6.5 - 8.1 g/dL   Albumin  3.6 3.5 - 5.0 g/dL   AST 17 15 - 41 U/L   ALT 7 0 - 44 U/L   Alkaline Phosphatase 128 (H) 38 - 126 U/L   Total Bilirubin 0.6 0.0 - 1.2 mg/dL   GFR, Estimated 57 (L) >60 mL/min    Comment: (NOTE) Calculated using the CKD-EPI Creatinine Equation (2021)    Anion gap 11 5 - 15    Comment: Performed at [facility], [address] [place], [place] [address]    DG Femur Min 2 Views Right Result Date: 03/08/2024 EXAM: 2 VIEW(S) XRAY OF THE RIGHT FEMUR 03/08/2024 03:52:41 AM COMPARISON: None available. CLINICAL HISTORY: fall Patient fell. FINDINGS: BONES AND JOINTS: Acute basicervical intertrochanteric fracture with mildly displaced fractured fragments involving the greater and lesser trochanters. Moderate degenerative changes within the acetabular joint. The femoral shaft is intact. Degenerative changes are also present within the  knee joint, which is suboptimally assessed. SOFT TISSUES: Unremarkable. IMPRESSION: 1. Basic cervical/intertrochanteric fracture with mild displacement involving the greater and lesser trochanters; femoral shaft is intact. 2. Moderate degenerative changes within the acetabular joint. 3. Degenerative changes within the knee joint, suboptimally assessed. Electronically signed by: Evalene Coho MD 03/08/2024 05:25 AM EST RP Workstation: [id number]   CT HEAD WO CONTRAST ( ) Result Date: 03/08/2024 EXAM: CT HEAD WITHOUT CONTRAST 03/08/2024 04:42:04 AM TECHNIQUE: CT of the head was performed without the administration of intravenous contrast. Automated exposure control, iterative reconstruction, and/or weight based adjustment of the mA/kV was utilized to reduce the radiation dose to as low as reasonably achievable. COMPARISON: None available. CLINICAL HISTORY: Head trauma, moderate to severe. FINDINGS: BRAIN AND VENTRICLES: Moderate generalized  cerebral and cerebellar volume loss. Moderate periventricular and deep cerebral white matter disease. Chronic lacunar infarcts within bilateral basal ganglia. Moderate calcific atheromatous disease within carotid siphons and vertebral arteries. No acute hemorrhage. No evidence of acute infarct. No hydrocephalus. No extra-axial collection. No mass effect or midline shift. ORBITS: Status post bilateral lens replacement. No acute abnormality. SINUSES: No acute abnormality. SOFT TISSUES AND SKULL: No acute soft tissue abnormality. No skull fracture. IMPRESSION: 1. No acute intracranial abnormality related to the reported head trauma. 2. Moderate generalized cerebral and cerebellar volume loss, moderate periventricular and deep cerebral white matter disease, chronic lacunar infarcts within bilateral basal ganglia, and moderate calcific atheromatous disease within the carotid siphons and vertebral arteries. Electronically signed by: Evalene Coho MD 03/08/2024 05:21 AM EST RP Workstation: [id number]   CT CHEST ABDOMEN PELVIS W CONTRAST Result Date: 03/08/2024 EXAM: CT CHEST, ABDOMEN AND PELVIS WITH CONTRAST 03/08/2024 04:42:04 AM TECHNIQUE: CT of the chest, abdomen and pelvis was performed with the administration of 75 mL of iohexol  (OMNIPAQUE ) 350 MG/ML injection. Multiplanar reformatted images are provided for review. Automated exposure control, iterative reconstruction, and/or weight based adjustment of the mA/kV was utilized to reduce the radiation dose to as low as reasonably achievable. COMPARISON: CT angiogram of the chest dated 02/07/2019. CLINICAL HISTORY: Polytrauma, blunt. FINDINGS: CHEST: MEDIASTINUM AND LYMPH NODES: Heart and pericardium are unremarkable. The central airways are clear. There are few shotty mediastinal lymph nodes. Mild calcific coronary artery disease. LUNGS AND PLEURA: Mild dependent atelectasis within the lower lobes bilaterally. No focal consolidation or pulmonary edema. No pleural  effusion. No pneumothorax. ABDOMEN AND PELVIS: LIVER: Unremarkable. GALLBLADDER AND BILE DUCTS: Unremarkable. No biliary ductal dilatation. SPLEEN: No acute abnormality. PANCREAS: No acute abnormality. ADRENAL GLANDS: No acute abnormality. KIDNEYS, URETERS AND BLADDER: The kidneys are mildly atrophic. There is a simple cyst arising from the lower pole of the right kidney, which does not require further follow up. Per consensus, no follow-up is needed for simple Bosniak type 1 and 2 renal cysts, unless the patient has a malignancy history or risk factors. No stones in the kidneys or ureters. No hydronephrosis. No perinephric or periureteral stranding. Urinary bladder is unremarkable. GI AND BOWEL: Stomach demonstrates no acute abnormality. There are numerous colonic diverticula present. There is a duodenal diverticulum. There is no bowel obstruction. REPRODUCTIVE ORGANS: No acute abnormality. PERITONEUM AND RETROPERITONEUM: No ascites. No free air. VASCULATURE: Aorta is normal in caliber. Abdominal aorta demonstrates moderate calcific atheromatous disease. ABDOMINAL AND PELVIS LYMPH NODES: No lymphadenopathy. BONES AND SOFT TISSUES: Mild-to-moderate degenerative changes are present throughout the thoracic spine. There is a mildly displaced right intertrochanteric fracture. The bony pelvis appears to be intact. There are moderate degenerative changes throughout the lumbar spine with multilevel degenerative  disc disease and facet arthrosis. No focal soft tissue abnormality. IMPRESSION: 1. Mildly displaced right intertrochanteric fracture. The bony pelvis appears to be intact. 2. Mild dependent atelectasis within the lower lobes bilaterally. 3. Mild calcific coronary artery disease and moderate calcific atheromatous disease of the abdominal aorta. 4. Numerous colonic diverticula and a duodenal diverticulum. Mildly atrophic kidneys with a simple right renal cyst requiring no further follow-up. Electronically signed by:  Evalene Coho MD 03/08/2024 05:18 AM EST RP Workstation: [id number]   CT CERVICAL SPINE WO CONTRAST Result Date: 03/08/2024 EXAM: CT CERVICAL SPINE WITHOUT CONTRAST 03/08/2024 04:42:04 AM TECHNIQUE: CT of the cervical spine was performed without the administration of intravenous contrast. Multiplanar reformatted images are provided for review. Automated exposure control, iterative reconstruction, and/or weight based adjustment of the mA/kV was utilized to reduce the radiation dose to as low as reasonably achievable. COMPARISON: None available. CLINICAL HISTORY: Neck trauma (Age >= 65y). Neck trauma. Age >= 65 years. FINDINGS: BONES AND ALIGNMENT: There is a chronic-appearing type 2 dense fracture, which is dorsally angulated by approximately 50 degrees. There is no apparent acute fracture. There is straightening of the normal cervical lordosis. The C3 through T1 vertebral bodies are essentially fused. There is slight degenerative anterolisthesis at C7-T1 and T1-T2. DEGENERATIVE CHANGES: There is moderate bilateral facet arthrosis present at C2-C3. The C3 through C6 facet joints are also fused. SOFT TISSUES: No prevertebral soft tissue swelling. There is moderate calcific atheromatous disease within the carotid bulbs bilaterally. IMPRESSION: 1. Chronic-appearing type 2 dens fracture, dorsally angulated by approximately 50 degrees. No apparent acute fracture. 2. Straightening of the normal cervical lordosis. 3. Fusion of the C3 through T1 vertebral bodies and C3 through C6 facet joints. 4. Moderate bilateral facet arthrosis at C2-3. 5. Slight degenerative anterolisthesis at C7-T1 and T1-2. 6. Moderate calcific atheromatous disease within the carotid bulbs bilaterally. Electronically signed by: Evalene Coho MD 03/08/2024 05:11 AM EST RP Workstation: [id number]    Review of Systems  HENT:  Negative for ear discharge, ear pain, hearing loss and tinnitus.   Eyes:  Negative for photophobia and pain.   Respiratory:  Negative for cough and shortness of breath.   Cardiovascular:  Negative for chest pain.  Gastrointestinal:  Negative for abdominal pain, nausea and vomiting.  Genitourinary:  Negative for dysuria, flank pain, frequency and urgency.  Musculoskeletal:  Positive for arthralgias (Right hip, chronic bilateral knees). Negative for back pain, myalgias and neck pain.  Neurological:  Negative for dizziness and headaches.  Hematological:  Does not bruise/bleed easily.  Psychiatric/Behavioral:  The patient is not nervous/anxious.    Blood pressure 135/72, pulse 88, temperature 98.3 F (36.8 C), temperature source Oral, resp. rate 17, SpO2 97%. Physical Exam Constitutional:      General: He is not in acute distress.    Appearance: He is well-developed. He is not diaphoretic.  HENT:     Head: Normocephalic and atraumatic.  Eyes:     General: No scleral icterus.       Right eye: No discharge.        Left eye: No discharge.     Conjunctiva/sclera: Conjunctivae normal.  Cardiovascular:     Rate and Rhythm: Normal rate and regular rhythm.  Pulmonary:     Effort: Pulmonary effort is normal. No respiratory distress.  Musculoskeletal:     Cervical back: Normal range of motion.     Comments: RLE No traumatic wounds, ecchymosis, or rash  Mild TTP hip  No knee or ankle effusion  Knee  stable to varus/ valgus and anterior/posterior stress  Sens DPN, SPN, TN intact  Motor EHL, ext, flex, evers 5/5  DP 2+, PT 2+, No significant edema  Skin:    General: Skin is warm and dry.  Neurological:     Mental Status: He is alert.  Psychiatric:        Mood and Affect: Mood normal.        Behavior: Behavior normal.     Assessment/Plan: Right hip fx -- Plan IMN today with Dr. Kendal. Please keep NPO.    Ozell DOROTHA Ned, PA-C Orthopedic Surgery [phone] 03/08/2024, 9:11 AM     [1]  Allergies Allergen Reactions   Cephalexin Hives   Penicillin G Hives    Did it involve  swelling of the face/tongue/throat, SOB, or low BP? No Did it involve sudden or severe rash/hives, skin peeling, or any reaction on the inside of your mouth or nose? Yes Did you need to seek medical attention at a hospital or doctor's office? Yes When did it last happen?       If all above answers are NO, may proceed with cephalosporin use.

## 2024-03-08 NOTE — Anesthesia Preprocedure Evaluation (Addendum)
"                                    Anesthesia Evaluation  Patient identified by MRN, date of birth, ID band Patient awake    Reviewed: Allergy & Precautions, NPO status , Patient's Chart, lab work & pertinent test results  Airway Mallampati: II  TM Distance: >3 FB Neck ROM: Full    Dental  (+) Missing   Pulmonary neg pulmonary ROS   Pulmonary exam normal        Cardiovascular hypertension, Pt. on medications and Pt. on home beta blockers + CAD  Normal cardiovascular exam     Neuro/Psych negative neurological ROS  negative psych ROS   GI/Hepatic negative GI ROS, Neg liver ROS,,,  Endo/Other  negative endocrine ROS    Renal/GU Renal InsufficiencyRenal disease     Musculoskeletal negative musculoskeletal ROS (+)    Abdominal  (+) + obese  Peds  Hematology  (+) Blood dyscrasia, anemia   Anesthesia Other Findings Right intertrochanteric femur fracture  Reproductive/Obstetrics                              Anesthesia Physical Anesthesia Plan  ASA: 3  Anesthesia Plan: General   Post-op Pain Management:    Induction: Intravenous  PONV Risk Score and Plan: 2 and Ondansetron , Dexamethasone  and Treatment may vary due to age or medical condition  Airway Management Planned: Oral ETT  Additional Equipment:   Intra-op Plan:   Post-operative Plan: Extubation in OR  Informed Consent: I have reviewed the patients History and Physical, chart, labs and discussed the procedure including the risks, benefits and alternatives for the proposed anesthesia with the patient or authorized representative who has indicated his/her understanding and acceptance.     Dental advisory given  Plan Discussed with: CRNA  Anesthesia Plan Comments:         Anesthesia Quick Evaluation  "

## 2024-03-08 NOTE — Progress Notes (Signed)
 Orthopedic Tech Progress Note Patient Details:  Tyler Macdonald 1940-11-29 [id number]  Patient ID: [name], male   DOB: 20-Jun-1940, 84 y.o.   MRN: [id number] No OHF. Age. Adine MARLA Blush 03/08/2024, 2:36 PM

## 2024-03-08 NOTE — ED Notes (Signed)
 Pain meds offered and refused pt states he does not like to take narcotics due to how they make him feel.

## 2024-03-08 NOTE — Interval H&P Note (Signed)
 History and Physical Interval Note:  03/08/2024 10:56 AM  Tyler Macdonald  has presented today for surgery, with the diagnosis of Right intertrochanteric femur fracture.  The various methods of treatment have been discussed with the patient and family. After consideration of risks, benefits and other options for treatment, the patient has consented to  Procedures: FIXATION, FRACTURE, INTERTROCHANTERIC, WITH INTRAMEDULLARY ROD (Right) as a surgical intervention.  The patient's history has been reviewed, patient examined, no change in status, stable for surgery.  I have reviewed the patient's chart and labs.  Questions were answered to the patient's satisfaction.     Joenathan Sakuma P Nicolina Hirt

## 2024-03-08 NOTE — H&P (Signed)
 "   History and Physical  Tyler Macdonald FMW:[id number] DOB: [birth year] DOA: 03/08/2024  PCP: Tyler Drivers, MD Patient coming from: Home  I have personally briefly reviewed patient's old medical records in [facility]   Chief Complaint: Right hip pain after fall  HPI: Tyler Macdonald is a 84 y.o. male past medical history essential hypertension and CVA comes into the ED for right hip pain after mechanical fall he relates he leg gave out lost his balance and fell about 8 steps endorses he did hit his head but no loss of consciousness.  In the ED: Was found to have a hemodynamically stable afebrile, imaging , X-ray showed mildly displaced greater and lesser trochanteric facture.  CT of the head showed no acute intracranial abnormalities moderate cerebral volume loss.  Chronic lacunar infarcts. CT C-spine showed chronic appearing type II dens 2 fracture no apparent acute fracture fusion at C3-T1.  CT scan of the abdomen pelvis showed mildly displaced right enteroenteric fracture the pelvis appears intact atelectasis at bilateral lower lobes   Review of Systems: All systems reviewed and apart from history of presenting illness, are negative.  Past Medical History:  Diagnosis Date   Gout    High cholesterol    Hypertension    Past Surgical History:  Procedure Laterality Date   KNEE SURGERY Right    SHOULDER SURGERY     Social History:  reports that he has never smoked. He does not have any smokeless tobacco history on file. He reports that he does not drink alcohol and does not use drugs.   Allergies[1]  Family History  Problem Relation Age of Onset   Heart attack Mother      Prior to Admission medications  Medication Sig Start Date End Date Taking? Authorizing Provider  metoprolol  succinate (TOPROL -XL) 50 MG 24 hr tablet Take 25 mg by mouth. 02/09/19  Yes [provider]  allopurinol  (ZYLOPRIM ) 300 MG tablet Take 300 mg by mouth daily.    [provider]  amLODipine  (NORVASC ) 10 MG tablet Take 1 tablet (10 mg total) by mouth daily. 02/09/19   Tyler Manuelita NOVAK, NP  aspirin  EC 81 MG tablet Take 81 mg by mouth daily.    [provider]  colchicine  0.6 MG tablet Take 0.6 mg by mouth 2 (two) times daily.    [provider]  diclofenac Sodium (VOLTAREN) 1 % GEL Apply 4 g topically 4 (four) times daily as needed.    [provider]  doxycycline  (VIBRAMYCIN ) 100 MG capsule Take 1 capsule (100 mg total) by mouth 2 (two) times daily. 06/19/19   Macdonald, Brittany, PA-C  fluticasone  (FLONASE ) 50 MCG/ACT nasal spray Place 1 spray into both nostrils daily as needed for allergies or rhinitis.    [provider]  guaiFENesin (MUCINEX) 600 MG 12 hr tablet Take 600 mg by mouth 2 (two) times daily as needed for to loosen phlegm.    [provider]  hydrochlorothiazide  (MICROZIDE ) 12.5 MG capsule Take 1 capsule (12.5 mg total) by mouth daily. 02/09/19 03/11/19  Tyler Manuelita NOVAK, NP  ketotifen  (ZADITOR ) 0.035 % ophthalmic solution Place 1 drop into both eyes 2 (two) times daily.    [provider]  metoprolol  succinate (TOPROL -XL) 25 MG 24 hr tablet Take 1 tablet (25 mg total) by mouth daily. 02/09/19   Tyler Manuelita NOVAK, NP  Omega-3 1000 MG CAPS Take 2 g by mouth in the morning.    [provider]  rosuvastatin  (  CRESTOR ) 40 MG tablet Take 1 tablet (40 mg total) by mouth daily at 6 PM. 02/09/19   Tyler Manuelita NOVAK, NP  sertraline  (ZOLOFT ) 25 MG tablet Take 12.5 mg by mouth daily.    [provider]  losartan  (COZAAR ) 100 MG tablet Take 100 mg by mouth daily.  06/19/19  [provider]   Physical Exam: Vitals:   03/08/24 0958 03/08/24 1210 03/08/24 1215 03/08/24 1230  BP: 138/82 (!) 157/72 (!) 141/76 139/70  Pulse: 88 89 86 84  Resp: 18 18 17 16   Temp: 97.7 F (36.5 C) (!) 97.2 F (36.2 C)    TempSrc: Oral     SpO2: 93% 97% 94% 91%  Weight: 122.5 kg     Height: 6' 7 (2.007 m)        General exam: Moderately built and nourished patient, lying comfortably supine. Head, eyes and ENT: Nontraumatic and normocephalic. Neck: Supple. No JVD, carotid bruit or thyromegaly. Lymphatics: No lymphadenopathy. Respiratory system: Clear to auscultation. No increased work of breathing. Cardiovascular system: S1 and S2 heard, RRR. No JVD. Gastrointestinal system: Abdomen is nondistended, soft and nontender. Normal bowel sounds heard. No organomegaly or masses appreciated. Central nervous system: Alert and oriented. No focal neurological deficits. Extremities: Symmetric 5 x 5 power. Peripheral pulses symmetrically felt.  Skin: No rashes or acute findings. Musculoskeletal system: Negative exam. Psychiatry: Pleasant and cooperative.   Labs on Admission:  Basic Metabolic Panel: Recent Labs  Lab 03/08/24 0310  NA 137  K 4.5  CL 102  CO2 24  GLUCOSE 100*  BUN 17  CREATININE 1.27*  CALCIUM  9.4   Liver Function Tests: Recent Labs  Lab 03/08/24 0310  AST 17  ALT 7  ALKPHOS 128*  BILITOT 0.6  PROT 7.4  ALBUMIN  3.6   No results for input(s): LIPASE, AMYLASE in the last 168 hours. No results for input(s): AMMONIA in the last 168 hours. CBC: Recent Labs  Lab 03/08/24 0310  WBC 14.2*  HGB 12.7*  HCT 38.5*  MCV 95.3  PLT 295   Cardiac Enzymes: No results for input(s): CKTOTAL, CKMB, CKMBINDEX, TROPONINI in the last 168 hours.  BNP (last 3 results) No results for input(s): PROBNP in the last 8760 hours. CBG: No results for input(s): GLUCAP in the last 168 hours.  Radiological Exams on Admission: DG C-Arm 1-60 Min-No Report Result Date: 03/08/2024 Fluoroscopy was utilized by the requesting physician.  No radiographic interpretation.   DG Femur Min 2 Views Right Result Date: 03/08/2024 EXAM: 2 VIEW(S) XRAY OF THE RIGHT FEMUR 03/08/2024 03:52:41 AM COMPARISON: None available. CLINICAL HISTORY: fall Patient fell. FINDINGS: BONES AND JOINTS:  Acute basicervical intertrochanteric fracture with mildly displaced fractured fragments involving the greater and lesser trochanters. Moderate degenerative changes within the acetabular joint. The femoral shaft is intact. Degenerative changes are also present within the knee joint, which is suboptimally assessed. SOFT TISSUES: Unremarkable. IMPRESSION: 1. Basic cervical/intertrochanteric fracture with mild displacement involving the greater and lesser trochanters; femoral shaft is intact. 2. Moderate degenerative changes within the acetabular joint. 3. Degenerative changes within the knee joint, suboptimally assessed. Electronically signed by: Evalene Coho MD 03/08/2024 05:25 AM EST RP Workstation: [id number]   CT HEAD WO CONTRAST ( ) Result Date: 03/08/2024 EXAM: CT HEAD WITHOUT CONTRAST 03/08/2024 04:42:04 AM TECHNIQUE: CT of the head was performed without the administration of intravenous contrast. Automated exposure control, iterative reconstruction, and/or weight based adjustment of the mA/kV was utilized to reduce the radiation dose to as low as reasonably achievable.  COMPARISON: None available. CLINICAL HISTORY: Head trauma, moderate to severe. FINDINGS: BRAIN AND VENTRICLES: Moderate generalized cerebral and cerebellar volume loss. Moderate periventricular and deep cerebral white matter disease. Chronic lacunar infarcts within bilateral basal ganglia. Moderate calcific atheromatous disease within carotid siphons and vertebral arteries. No acute hemorrhage. No evidence of acute infarct. No hydrocephalus. No extra-axial collection. No mass effect or midline shift. ORBITS: Status post bilateral lens replacement. No acute abnormality. SINUSES: No acute abnormality. SOFT TISSUES AND SKULL: No acute soft tissue abnormality. No skull fracture. IMPRESSION: 1. No acute intracranial abnormality related to the reported head trauma. 2. Moderate generalized cerebral and cerebellar volume loss, moderate  periventricular and deep cerebral white matter disease, chronic lacunar infarcts within bilateral basal ganglia, and moderate calcific atheromatous disease within the carotid siphons and vertebral arteries. Electronically signed by: Evalene Coho MD 03/08/2024 05:21 AM EST RP Workstation: [id number]   CT CHEST ABDOMEN PELVIS W CONTRAST Result Date: 03/08/2024 EXAM: CT CHEST, ABDOMEN AND PELVIS WITH CONTRAST 03/08/2024 04:42:04 AM TECHNIQUE: CT of the chest, abdomen and pelvis was performed with the administration of 75 mL of iohexol  (OMNIPAQUE ) 350 MG/ML injection. Multiplanar reformatted images are provided for review. Automated exposure control, iterative reconstruction, and/or weight based adjustment of the mA/kV was utilized to reduce the radiation dose to as low as reasonably achievable. COMPARISON: CT angiogram of the chest dated 02/07/2019. CLINICAL HISTORY: Polytrauma, blunt. FINDINGS: CHEST: MEDIASTINUM AND LYMPH NODES: Heart and pericardium are unremarkable. The central airways are clear. There are few shotty mediastinal lymph nodes. Mild calcific coronary artery disease. LUNGS AND PLEURA: Mild dependent atelectasis within the lower lobes bilaterally. No focal consolidation or pulmonary edema. No pleural effusion. No pneumothorax. ABDOMEN AND PELVIS: LIVER: Unremarkable. GALLBLADDER AND BILE DUCTS: Unremarkable. No biliary ductal dilatation. SPLEEN: No acute abnormality. PANCREAS: No acute abnormality. ADRENAL GLANDS: No acute abnormality. KIDNEYS, URETERS AND BLADDER: The kidneys are mildly atrophic. There is a simple cyst arising from the lower pole of the right kidney, which does not require further follow up. Per consensus, no follow-up is needed for simple Bosniak type 1 and 2 renal cysts, unless the patient has a malignancy history or risk factors. No stones in the kidneys or ureters. No hydronephrosis. No perinephric or periureteral stranding. Urinary bladder is unremarkable. GI AND BOWEL:  Stomach demonstrates no acute abnormality. There are numerous colonic diverticula present. There is a duodenal diverticulum. There is no bowel obstruction. REPRODUCTIVE ORGANS: No acute abnormality. PERITONEUM AND RETROPERITONEUM: No ascites. No free air. VASCULATURE: Aorta is normal in caliber. Abdominal aorta demonstrates moderate calcific atheromatous disease. ABDOMINAL AND PELVIS LYMPH NODES: No lymphadenopathy. BONES AND SOFT TISSUES: Mild-to-moderate degenerative changes are present throughout the thoracic spine. There is a mildly displaced right intertrochanteric fracture. The bony pelvis appears to be intact. There are moderate degenerative changes throughout the lumbar spine with multilevel degenerative disc disease and facet arthrosis. No focal soft tissue abnormality. IMPRESSION: 1. Mildly displaced right intertrochanteric fracture. The bony pelvis appears to be intact. 2. Mild dependent atelectasis within the lower lobes bilaterally. 3. Mild calcific coronary artery disease and moderate calcific atheromatous disease of the abdominal aorta. 4. Numerous colonic diverticula and a duodenal diverticulum. Mildly atrophic kidneys with a simple right renal cyst requiring no further follow-up. Electronically signed by: Evalene Coho MD 03/08/2024 05:18 AM EST RP Workstation: [id number]   CT CERVICAL SPINE WO CONTRAST Result Date: 03/08/2024 EXAM: CT CERVICAL SPINE WITHOUT CONTRAST 03/08/2024 04:42:04 AM TECHNIQUE: CT of the cervical spine was performed without the administration of  intravenous contrast. Multiplanar reformatted images are provided for review. Automated exposure control, iterative reconstruction, and/or weight based adjustment of the mA/kV was utilized to reduce the radiation dose to as low as reasonably achievable. COMPARISON: None available. CLINICAL HISTORY: Neck trauma (Age >= 65y). Neck trauma. Age >= 65 years. FINDINGS: BONES AND ALIGNMENT: There is a chronic-appearing type 2 dense  fracture, which is dorsally angulated by approximately 50 degrees. There is no apparent acute fracture. There is straightening of the normal cervical lordosis. The C3 through T1 vertebral bodies are essentially fused. There is slight degenerative anterolisthesis at C7-T1 and T1-T2. DEGENERATIVE CHANGES: There is moderate bilateral facet arthrosis present at C2-C3. The C3 through C6 facet joints are also fused. SOFT TISSUES: No prevertebral soft tissue swelling. There is moderate calcific atheromatous disease within the carotid bulbs bilaterally. IMPRESSION: 1. Chronic-appearing type 2 dens fracture, dorsally angulated by approximately 50 degrees. No apparent acute fracture. 2. Straightening of the normal cervical lordosis. 3. Fusion of the C3 through T1 vertebral bodies and C3 through C6 facet joints. 4. Moderate bilateral facet arthrosis at C2-3. 5. Slight degenerative anterolisthesis at C7-T1 and T1-2. 6. Moderate calcific atheromatous disease within the carotid bulbs bilaterally. Electronically signed by: Evalene Coho MD 03/08/2024 05:11 AM EST RP Workstation: [id number]    EKG: Independently reviewed.  None  Assessment/Plan Closed right hip fracture, initial encounter [facility]) Orthopedic surgery was consulted, he status post intramedullary nailing of the right trochanteric femur fracture. Continue narcotics for pain control and Robaxin . Anticoagulation and narcotics per orthopedic surgery. Will start on a bowel regimen. Will allow a diet. Strict I's and O's and daily weights. Consult PT OT as I anticipate he will need skilled nursing facility placement.  Mechanical fall: Appears to be neurological intact. CT scan of the abdomen pelvis show mildly displaced intertrochanteric fracture pelvis is intact. CT of the C-spine showed chronic appearing T2 and fracture no acute apparent fracture fusion of C3-T1. CT of the head showed no acute intracranial abnormality.  Essential hypertension Resume  Norvasc  hold hydrochlorothiazide . Hydralazine  orally as needed for blood pressure greater than 160.  Gout Resume allopurinol .    DVT Prophylaxis: lovenox  Code Status: Full  Family Communication: None  Disposition Plan: inpatient       It is my clinical opinion that admission to INPATIENT is reasonable and necessary in this 84 y.o. male acute traumatic right hip fracture secondary to mechanical fall   Given the aforementioned, the predictability of an adverse outcome is felt to be significant. I expect that the patient will require at least 2 midnights in the hospital to treat this condition.  Erle Odell Castor MD [facility]   03/08/2024, 1:10 PM       [1]  Allergies Allergen Reactions   Cephalexin Hives   Penicillin G Hives    Did it involve swelling of the face/tongue/throat, SOB, or low BP? No Did it involve sudden or severe rash/hives, skin peeling, or any reaction on the inside of your mouth or nose? Yes Did you need to seek medical attention at a hospital or doctor's office? Yes When did it last happen?       If all above answers are NO, may proceed with cephalosporin use.   "

## 2024-03-08 NOTE — Progress Notes (Incomplete)
 Initial Nutrition Assessment  DOCUMENTATION CODES:   Not applicable  INTERVENTION:  Encourage po intake Currently on regular diet 1 packet Juven BID, each packet provides 95 calories, 2.5 grams of protein (collagen), to support wound healing Ensure Plus High Protein po BID, each supplement provides 350 kcal and 20 grams of protein - Prefers vanilla  MVI with minerals daily Recommend checking Vitamin D labs  Recommend standing weight   NUTRITION DIAGNOSIS:   Increased nutrient needs related to hip fracture, post-op healing as evidenced by estimated needs.  GOAL:   Patient will meet greater than or equal to 90% of their needs  MONITOR:   PO intake, Supplement acceptance, Labs, Weight trends  REASON FOR ASSESSMENT:   Consult Hip fracture protocol  ASSESSMENT:   84 y.o. male with PMH of HTN, and CVA who presents after a mechanical fall. Found to have right hip fracture.   1/28 - s/p Cephalomedullary nailing of right intertrochanteric femur fracture, Regular diet  Pt up in chair. Pt lives alone, and does his own grocery shopping and cooking, reports his UBW to be around 300-310 lbs. Pt intentionally lost weight within the past couple months to 270 lbs. Pt states he did not eat as much and was only eating 1-2 small meals per day with snacking on peanut butter crackers in between. Drinking soda and milk everyday. Some of his meals would not contain a protein and be mostly convenience foods. Does eat takeout occasionally.  Denies food insecurity.   Had 100% of his breakfast this morning. Dicussed the importance of increased calories and protein. Provided different examples of foods high in protein.  Also provided ideas to promote variety and to incorporate additional nutrient dense foods into patient's diet. Discussed eating small frequent meals and snacks to assist in increasing overall po intake. Teach back method used. Encouraged having a protein with every meal. Pt agreeable to  Ensures.   Admit weight: 122.5 kg - stated Current weight: 122.5 kg   Average Meal Intake: 1/29: 100% intake x 1 recorded meals  Nutritionally Relevant Medications: Scheduled Meds:  feeding supplement  237 mL Oral BID BM   metoprolol  succinate  25 mg Oral Daily   multivitamin with minerals  1 tablet Oral Daily   mupirocin  ointment  1 Application Nasal BID   nutrition supplement (JUVEN)  1 packet Oral BID BM   rosuvastatin   40 mg Oral q1800   senna-docusate  1 tablet Oral BID   sertraline   12.5 mg Oral Daily   Labs Reviewed: Potassium 5.3 BUN 29 Creatinine 1.76 GFR 38 CBG ranges from 100-133 mg/dL over the last 24 hours HgbA1c 5.4  NUTRITION - FOCUSED PHYSICAL EXAM:  Flowsheet Row Most Recent Value  Orbital Region No depletion  Upper Arm Region No depletion  Thoracic and Lumbar Region No depletion  Buccal Region No depletion  Temple Region No depletion  Clavicle Bone Region No depletion  Clavicle and Acromion Bone Region No depletion  Scapular Bone Region No depletion  Dorsal Hand No depletion  Patellar Region No depletion  Anterior Thigh Region No depletion  Posterior Calf Region No depletion  Edema (RD Assessment) Mild  Hair Reviewed  Eyes Reviewed  Mouth Reviewed  Skin Reviewed  Nails Reviewed    Diet Order:   Diet Order             Diet regular Room service appropriate? Yes with Assist; Fluid consistency: Thin  Diet effective now  EDUCATION NEEDS:   Education needs have been addressed  Skin:  Skin Assessment: Reviewed RN Assessment (Closed surgical incision, hip)  Last BM:  PTA  Height:   Ht Readings from Last 1 Encounters:  03/08/24 6' 7 (2.007 m)    Weight:   Wt Readings from Last 1 Encounters:  03/08/24 122.5 kg    Ideal Body Weight:  100 kg  BMI:  Body mass index is 30.42 kg/m.  Estimated Nutritional Needs:   Kcal:  2300-2500 kcal  Protein:  140-160 gm  Fluid:  >2L/day   Olivia Kenning,  RD Registered Dietitian  See Amion for more information

## 2024-03-08 NOTE — ED Triage Notes (Signed)
 Patient states his knee gave out and fell down approximately 8 steps at home. Patient is not sure if he hit his head during fall. He states that his right hip/groin area hurts to move. EMS states that patient did have complaint of lower back pain on scene.

## 2024-03-08 NOTE — Anesthesia Procedure Notes (Signed)
 Procedure Name: Intubation Date/Time: 03/08/2024 11:09 AM  Performed by: Christopher Comings, CRNAPre-anesthesia Checklist: Patient identified, Emergency Drugs available, Suction available and Patient being monitored Patient Re-evaluated:Patient Re-evaluated prior to induction Oxygen Delivery Method: Circle system utilized Preoxygenation: Pre-oxygenation with 100% oxygen Induction Type: IV induction Ventilation: Mask ventilation without difficulty Laryngoscope Size: Mac and 4 Grade View: Grade I Tube type: Oral Tube size: 7.5 mm Number of attempts: 1 Airway Equipment and Method: Stylet and Oral airway Placement Confirmation: ETT inserted through vocal cords under direct vision, positive ETCO2 and breath sounds checked- equal and bilateral Secured at: 23 cm Tube secured with: Tape Dental Injury: Teeth and Oropharynx as per pre-operative assessment

## 2024-03-08 NOTE — Op Note (Signed)
 Orthopaedic Surgery Operative Note (CSN: [id number] ) Date of Surgery: 03/08/2024  Admit Date: 03/08/2024   Diagnoses: Pre-Op Diagnoses: Right intertrochanteric femur fracture  Post-Op Diagnosis: Same  Procedures: CPT 27245-Cephalomedullary nailing of right intertrochanteric femur fracture  Surgeons : Primary: Kendal Franky SQUIBB, MD  Assistant: Lauraine Moores, PA-C  Location: OR 3   Anesthesia: General   Antibiotics: Ancef 2g preop   Tourniquet time: None    Estimated Blood Loss: 75 mL  Complications:* No complications entered in OR log *   Specimens:* No specimens in log *   Implants: Implant Name Type Inv. Item Serial No. Manufacturer Lot No. LRB No. Used Action  NAIL INTERTAN 10X18 130D 10S - [id number] Nail NAIL INTERTAN 10X18 130D 10S  [employer] [id number] Right 1 Implanted  SCREW LAG COMPR KIT 110/105 - [id number] Screw SCREW LAG COMPR KIT 110/105  [employer] [id number] Right 1 Implanted  SCREW TRIGEN LOW PROF 5.0X40 - [id number] Screw SCREW TRIGEN LOW PROF 5.0X40  [employer] [id number] Right 1 Implanted     Indications for Surgery: 84 year old male who sustained a ground-level fall with a right intertrochanteric femur fracture.  Due to the unstable nature of his injury I recommend proceeding with cephalomedullary nailing of the right hip.  Risks and benefits were discussed with the patient.  Risks include but not limited to bleeding, infection, malunion, nonunion, hardware failure, hardware rotation, nerve and blood vessel injury, DVT, even the possibility anesthetic complications.  He agreed to proceed with surgery and consent was obtained.  Operative Findings: 1.  Cephalomedullary nailing of right intertrochanteric femur fracture using [employer] 10 x 180 mm nail with a 110 mm lag screw and a 105 mm compression screw.  Procedure: The patient was identified in the preoperative holding area. Consent was confirmed  with the patient and their family and all questions were answered. The operative extremity was marked after confirmation with the patient. he was then brought back to the operating room by our anesthesia colleagues.  He was placed under general anesthetic and carefully transferred over to a Hana table.  Traction was applied and fluoroscopic imaging showed the unstable nature of his injury.  Alignment was maintained and the right hip was then prepped and draped in usual sterile fashion.  A timeout was performed to verify the patient, the procedure, and the extremity.  Preoperative antibiotics were dosed.  A small incision proximal to the greater trochanter was made and carried down through skin and subcutaneous tissue.  A threaded guidewire was placed at the tip of the greater trochanter and advanced into the proximal metaphysis.  An entry reamer was used to enter the medullary canal.  Then placed a 10 x 180 mm nail attached to a targeting arm down the center of the canal.  I then used the targeting arm to direct a threaded guidewire into the head/neck segment.  I confirmed adequate tip apex distance and then measured the length.  I decided to use a 110 mm lag screw.  I then drilled the path for the compression screw and placed an antirotation bar.  I then drilled the path for the lag screw and placed the lag screw.  I then placed a compression screw and compressed approximately 8 to 10 mm.  I statically locked the proximal portion of the nail.  I then used the targeting arm to place a lateral to medial distal interlocking screw.  Final fluoroscopic imaging was obtained.  The incisions were  irrigated and closed with 2-0 Monocryl and Dermabond.  Sterile dressing was applied.  The patient was then awoke from anesthesia and taken to the PACU in stable condition.  Post Op Plan/Instructions: Patient will be weightbearing as tolerated to the right lower extremity.  He will receive postoperative Ancef.  He will be  placed on Lovenox  for DVT prophylaxis and discharged on an oral DOAC.  Will have him mobilize with physical and Occupational Therapy.  I was present and performed the entire surgery.  Lauraine Moores, PA-C did assist me throughout the case. An assistant was necessary given the difficulty in approach, maintenance of reduction and ability to instrument the fracture.   Franky Light, MD [facility]

## 2024-03-08 NOTE — H&P (View-Only) (Signed)
 Reason for Consult:Right hip fx Referring Physician: Adriana Grams Time called: [id number] Time at bedside: 0904   Tyler Macdonald is an 84 y.o. male.  HPI: Tyler Macdonald was coming down his stairs when his right knee gave out and he fell. He had immediate right hip pain and could not get up. He was brought to the ED where x-rays showed a right hip fx and orthopedic surgery was consulted. He lives at home alone and uses some crutches to help him ambulate.  Past Medical History:  Diagnosis Date   Gout    High cholesterol    Hypertension     Past Surgical History:  Procedure Laterality Date   KNEE SURGERY Right    SHOULDER SURGERY      No family history on file.  Social History:  reports that he has never smoked. He does not have any smokeless tobacco history on file. He reports that he does not drink alcohol and does not use drugs.  Allergies: Allergies[1]  Medications: I have reviewed the patient's current medications.  Results for orders placed or performed during the hospital encounter of 03/08/24 (from the past 48 hours)  CBC     Status: Abnormal   Collection Time: 03/08/24  3:10 AM  Result Value Ref Range   WBC 14.2 (H) 4.0 - 10.5 K/uL   RBC 4.04 (L) 4.22 - 5.81 MIL/uL   Hemoglobin 12.7 (L) 13.0 - 17.0 g/dL   HCT 61.4 (L) 60.9 - 47.9 %   MCV 95.3 80.0 - 100.0 fL   MCH 31.4 26.0 - 34.0 pg   MCHC 33.0 30.0 - 36.0 g/dL   RDW 86.5 88.4 - 84.4 %   Platelets 295 150 - 400 K/uL   nRBC 0.0 0.0 - 0.2 %    Comment: Performed at [facility], [address] [place], [place] [address]  Comprehensive metabolic panel with GFR     Status: Abnormal   Collection Time: 03/08/24  3:10 AM  Result Value Ref Range   Sodium 137 135 - 145 mmol/L   Potassium 4.5 3.5 - 5.1 mmol/L   Chloride 102 98 - 111 mmol/L   CO2 24 22 - 32 mmol/L   Glucose, Bld 100 (H) 70 - 99 mg/dL    Comment: Glucose reference range applies only to samples taken after fasting for at least 8 hours.   BUN 17 8 - 23  mg/dL   Creatinine, Ser 8.72 (H) 0.61 - 1.24 mg/dL   Calcium  9.4 8.9 - 10.3 mg/dL   Total Protein 7.4 6.5 - 8.1 g/dL   Albumin  3.6 3.5 - 5.0 g/dL   AST 17 15 - 41 U/L   ALT 7 0 - 44 U/L   Alkaline Phosphatase 128 (H) 38 - 126 U/L   Total Bilirubin 0.6 0.0 - 1.2 mg/dL   GFR, Estimated 57 (L) >60 mL/min    Comment: (NOTE) Calculated using the CKD-EPI Creatinine Equation (2021)    Anion gap 11 5 - 15    Comment: Performed at [facility], [address] [place], [place] [address]    DG Femur Min 2 Views Right Result Date: 03/08/2024 EXAM: 2 VIEW(S) XRAY OF THE RIGHT FEMUR 03/08/2024 03:52:41 AM COMPARISON: None available. CLINICAL HISTORY: fall Patient fell. FINDINGS: BONES AND JOINTS: Acute basicervical intertrochanteric fracture with mildly displaced fractured fragments involving the greater and lesser trochanters. Moderate degenerative changes within the acetabular joint. The femoral shaft is intact. Degenerative changes are also present within the  knee joint, which is suboptimally assessed. SOFT TISSUES: Unremarkable. IMPRESSION: 1. Basic cervical/intertrochanteric fracture with mild displacement involving the greater and lesser trochanters; femoral shaft is intact. 2. Moderate degenerative changes within the acetabular joint. 3. Degenerative changes within the knee joint, suboptimally assessed. Electronically signed by: Evalene Coho MD 03/08/2024 05:25 AM EST RP Workstation: [id number]   CT HEAD WO CONTRAST ( ) Result Date: 03/08/2024 EXAM: CT HEAD WITHOUT CONTRAST 03/08/2024 04:42:04 AM TECHNIQUE: CT of the head was performed without the administration of intravenous contrast. Automated exposure control, iterative reconstruction, and/or weight based adjustment of the mA/kV was utilized to reduce the radiation dose to as low as reasonably achievable. COMPARISON: None available. CLINICAL HISTORY: Head trauma, moderate to severe. FINDINGS: BRAIN AND VENTRICLES: Moderate generalized  cerebral and cerebellar volume loss. Moderate periventricular and deep cerebral white matter disease. Chronic lacunar infarcts within bilateral basal ganglia. Moderate calcific atheromatous disease within carotid siphons and vertebral arteries. No acute hemorrhage. No evidence of acute infarct. No hydrocephalus. No extra-axial collection. No mass effect or midline shift. ORBITS: Status post bilateral lens replacement. No acute abnormality. SINUSES: No acute abnormality. SOFT TISSUES AND SKULL: No acute soft tissue abnormality. No skull fracture. IMPRESSION: 1. No acute intracranial abnormality related to the reported head trauma. 2. Moderate generalized cerebral and cerebellar volume loss, moderate periventricular and deep cerebral white matter disease, chronic lacunar infarcts within bilateral basal ganglia, and moderate calcific atheromatous disease within the carotid siphons and vertebral arteries. Electronically signed by: Evalene Coho MD 03/08/2024 05:21 AM EST RP Workstation: [id number]   CT CHEST ABDOMEN PELVIS W CONTRAST Result Date: 03/08/2024 EXAM: CT CHEST, ABDOMEN AND PELVIS WITH CONTRAST 03/08/2024 04:42:04 AM TECHNIQUE: CT of the chest, abdomen and pelvis was performed with the administration of 75 mL of iohexol  (OMNIPAQUE ) 350 MG/ML injection. Multiplanar reformatted images are provided for review. Automated exposure control, iterative reconstruction, and/or weight based adjustment of the mA/kV was utilized to reduce the radiation dose to as low as reasonably achievable. COMPARISON: CT angiogram of the chest dated 02/07/2019. CLINICAL HISTORY: Polytrauma, blunt. FINDINGS: CHEST: MEDIASTINUM AND LYMPH NODES: Heart and pericardium are unremarkable. The central airways are clear. There are few shotty mediastinal lymph nodes. Mild calcific coronary artery disease. LUNGS AND PLEURA: Mild dependent atelectasis within the lower lobes bilaterally. No focal consolidation or pulmonary edema. No pleural  effusion. No pneumothorax. ABDOMEN AND PELVIS: LIVER: Unremarkable. GALLBLADDER AND BILE DUCTS: Unremarkable. No biliary ductal dilatation. SPLEEN: No acute abnormality. PANCREAS: No acute abnormality. ADRENAL GLANDS: No acute abnormality. KIDNEYS, URETERS AND BLADDER: The kidneys are mildly atrophic. There is a simple cyst arising from the lower pole of the right kidney, which does not require further follow up. Per consensus, no follow-up is needed for simple Bosniak type 1 and 2 renal cysts, unless the patient has a malignancy history or risk factors. No stones in the kidneys or ureters. No hydronephrosis. No perinephric or periureteral stranding. Urinary bladder is unremarkable. GI AND BOWEL: Stomach demonstrates no acute abnormality. There are numerous colonic diverticula present. There is a duodenal diverticulum. There is no bowel obstruction. REPRODUCTIVE ORGANS: No acute abnormality. PERITONEUM AND RETROPERITONEUM: No ascites. No free air. VASCULATURE: Aorta is normal in caliber. Abdominal aorta demonstrates moderate calcific atheromatous disease. ABDOMINAL AND PELVIS LYMPH NODES: No lymphadenopathy. BONES AND SOFT TISSUES: Mild-to-moderate degenerative changes are present throughout the thoracic spine. There is a mildly displaced right intertrochanteric fracture. The bony pelvis appears to be intact. There are moderate degenerative changes throughout the lumbar spine with multilevel degenerative  disc disease and facet arthrosis. No focal soft tissue abnormality. IMPRESSION: 1. Mildly displaced right intertrochanteric fracture. The bony pelvis appears to be intact. 2. Mild dependent atelectasis within the lower lobes bilaterally. 3. Mild calcific coronary artery disease and moderate calcific atheromatous disease of the abdominal aorta. 4. Numerous colonic diverticula and a duodenal diverticulum. Mildly atrophic kidneys with a simple right renal cyst requiring no further follow-up. Electronically signed by:  Evalene Coho MD 03/08/2024 05:18 AM EST RP Workstation: [id number]   CT CERVICAL SPINE WO CONTRAST Result Date: 03/08/2024 EXAM: CT CERVICAL SPINE WITHOUT CONTRAST 03/08/2024 04:42:04 AM TECHNIQUE: CT of the cervical spine was performed without the administration of intravenous contrast. Multiplanar reformatted images are provided for review. Automated exposure control, iterative reconstruction, and/or weight based adjustment of the mA/kV was utilized to reduce the radiation dose to as low as reasonably achievable. COMPARISON: None available. CLINICAL HISTORY: Neck trauma (Age >= 65y). Neck trauma. Age >= 65 years. FINDINGS: BONES AND ALIGNMENT: There is a chronic-appearing type 2 dense fracture, which is dorsally angulated by approximately 50 degrees. There is no apparent acute fracture. There is straightening of the normal cervical lordosis. The C3 through T1 vertebral bodies are essentially fused. There is slight degenerative anterolisthesis at C7-T1 and T1-T2. DEGENERATIVE CHANGES: There is moderate bilateral facet arthrosis present at C2-C3. The C3 through C6 facet joints are also fused. SOFT TISSUES: No prevertebral soft tissue swelling. There is moderate calcific atheromatous disease within the carotid bulbs bilaterally. IMPRESSION: 1. Chronic-appearing type 2 dens fracture, dorsally angulated by approximately 50 degrees. No apparent acute fracture. 2. Straightening of the normal cervical lordosis. 3. Fusion of the C3 through T1 vertebral bodies and C3 through C6 facet joints. 4. Moderate bilateral facet arthrosis at C2-3. 5. Slight degenerative anterolisthesis at C7-T1 and T1-2. 6. Moderate calcific atheromatous disease within the carotid bulbs bilaterally. Electronically signed by: Evalene Coho MD 03/08/2024 05:11 AM EST RP Workstation: [id number]    Review of Systems  HENT:  Negative for ear discharge, ear pain, hearing loss and tinnitus.   Eyes:  Negative for photophobia and pain.   Respiratory:  Negative for cough and shortness of breath.   Cardiovascular:  Negative for chest pain.  Gastrointestinal:  Negative for abdominal pain, nausea and vomiting.  Genitourinary:  Negative for dysuria, flank pain, frequency and urgency.  Musculoskeletal:  Positive for arthralgias (Right hip, chronic bilateral knees). Negative for back pain, myalgias and neck pain.  Neurological:  Negative for dizziness and headaches.  Hematological:  Does not bruise/bleed easily.  Psychiatric/Behavioral:  The patient is not nervous/anxious.    Blood pressure 135/72, pulse 88, temperature 98.3 F (36.8 C), temperature source Oral, resp. rate 17, SpO2 97%. Physical Exam Constitutional:      General: He is not in acute distress.    Appearance: He is well-developed. He is not diaphoretic.  HENT:     Head: Normocephalic and atraumatic.  Eyes:     General: No scleral icterus.       Right eye: No discharge.        Left eye: No discharge.     Conjunctiva/sclera: Conjunctivae normal.  Cardiovascular:     Rate and Rhythm: Normal rate and regular rhythm.  Pulmonary:     Effort: Pulmonary effort is normal. No respiratory distress.  Musculoskeletal:     Cervical back: Normal range of motion.     Comments: RLE No traumatic wounds, ecchymosis, or rash  Mild TTP hip  No knee or ankle effusion  Knee  stable to varus/ valgus and anterior/posterior stress  Sens DPN, SPN, TN intact  Motor EHL, ext, flex, evers 5/5  DP 2+, PT 2+, No significant edema  Skin:    General: Skin is warm and dry.  Neurological:     Mental Status: He is alert.  Psychiatric:        Mood and Affect: Mood normal.        Behavior: Behavior normal.     Assessment/Plan: Right hip fx -- Plan IMN today with Dr. Kendal. Please keep NPO.    Tyler DOROTHA Ned, PA-C Orthopedic Surgery [phone] 03/08/2024, 9:11 AM     [1]  Allergies Allergen Reactions   Cephalexin Hives   Penicillin G Hives    Did it involve  swelling of the face/tongue/throat, SOB, or low BP? No Did it involve sudden or severe rash/hives, skin peeling, or any reaction on the inside of your mouth or nose? Yes Did you need to seek medical attention at a hospital or doctor's office? Yes When did it last happen?       If all above answers are NO, may proceed with cephalosporin use.

## 2024-03-08 NOTE — ED Provider Notes (Signed)
 " [facility] Emergency Department Provider Note MRN:  [id number]  Arrival date & time: 03/08/24     Chief Complaint   Fall and Hip Pain (Mostly in groin)   History of Present Illness   Tyler Macdonald is a 84 y.o. year-old male with a history of hypertension presenting to the ED with chief complaint of fall.  Patient reports losing his balance and falling down 6-8 steps.  Endorses head trauma but no loss of consciousness.  Feels nauseated.  No neck pain.  Some low back pain.  Most of his pain is coming from his right groin/hip region.  No abdominal pain.  Review of Systems  A thorough review of systems was obtained and all systems are negative except as noted in the HPI and PMH.   Patient's Health History    Past Medical History:  Diagnosis Date   Gout    High cholesterol    Hypertension     Past Surgical History:  Procedure Laterality Date   KNEE SURGERY Right    SHOULDER SURGERY      No family history on file.  Social History   Socioeconomic History   Marital status: Divorced    Spouse name: Not on file   Number of children: Not on file   Years of education: Not on file   Highest education level: Not on file  Occupational History   Not on file  Tobacco Use   Smoking status: Never   Smokeless tobacco: Not on file  Substance and Sexual Activity   Alcohol use: Never   Drug use: Never   Sexual activity: Not on file  Other Topics Concern   Not on file  Social History Narrative   Not on file   Social Drivers of Health   Tobacco Use: Medium Risk (12/25/2022)   Received from [facility]   Patient History    Smoking Tobacco Use: Former    Smokeless Tobacco Use: Unknown    Passive Exposure: Not on file  Financial Resource Strain: Low Risk (11/23/2022)   Received from [facility]   Overall Financial Resource Strain (CARDIA)    Difficulty of Paying Living Expenses: Not hard at all  Food Insecurity: No Food Insecurity  (11/23/2022)   Received from [facility]   Epic    Within the past 12 months, you worried that your food would run out before you got the money to buy more.: Never true    Within the past 12 months, the food you bought just didn't last and you didn't have money to get more.: Never true  Transportation Needs: No Transportation Needs (11/23/2022)   Received from [facility]   PRAPARE - Transportation    Lack of Transportation (Medical): No    Lack of Transportation (Non-Medical): No  Physical Activity: Not on file  Stress: Not on file  Social Connections: Not on file  Intimate Partner Violence: Not on file  Depression (EYV7-0): Not on file  Alcohol Screen: Not on file  Housing: Not on file  Utilities: Low Risk (11/23/2022)   Received from [facility]   Utilities    Within the past 12 months, have you been unable to get utilities(heat, electricity) when it was really needed?: No  Health Literacy: Not on file     Physical Exam   Vitals:   03/08/24 0241 03/08/24 0243  BP:  (!) 172/89  Pulse: 83 87  Resp: 18 18  Temp: (!) 97  F (36.1 C)   SpO2: 99% 96%    CONSTITUTIONAL: Well-appearing, NAD NEURO/PSYCH:  Alert and oriented x 3, no focal deficits EYES:  eyes equal and reactive ENT/NECK:  no LAD, no JVD CARDIO: Regular rate, well-perfused, normal S1 and S2 PULM:  CTAB no wheezing or rhonchi GI/GU:  non-distended, non-tender MSK/SPINE:  No gross deformities, no edema SKIN:  no rash, atraumatic   *Additional and/or pertinent findings included in MDM below  Diagnostic and Interventional Summary    EKG Interpretation Date/Time:    Ventricular Rate:    PR Interval:    QRS Duration:    QT Interval:    QTC Calculation:   R Axis:      Text Interpretation:         Labs Reviewed  CBC - Abnormal; Notable for the following components:      Result Value   WBC 14.2 (*)    RBC 4.04 (*)    Hemoglobin 12.7 (*)    HCT 38.5 (*)    All other components within  normal limits  COMPREHENSIVE METABOLIC PANEL WITH GFR - Abnormal; Notable for the following components:   Glucose, Bld 100 (*)    Creatinine, Ser 1.27 (*)    Alkaline Phosphatase 128 (*)    GFR, Estimated 57 (*)    All other components within normal limits    CT HEAD WO CONTRAST ( )  Final Result    CT CERVICAL SPINE WO CONTRAST  Final Result    CT CHEST ABDOMEN PELVIS W CONTRAST  Final Result    DG Femur Min 2 Views Right  Final Result      Medications  morphine  (PF) 4 MG/ML injection 4 mg (4 mg Intravenous Given 03/08/24 0306)  ondansetron  (ZOFRAN ) injection 4 mg (4 mg Intravenous Given 03/08/24 0314)  iohexol  (OMNIPAQUE ) 350 MG/ML injection 75 mL (75 mLs Intravenous Contrast Given 03/08/24 0437)     Procedures  /  Critical Care Procedures  ED Course and Medical Decision Making  Initial Impression and Ddx Suspicion for pelvic fracture based on the location of patient's pain.  Given his advanced age and mechanism of injury other considerations include intracranial bleeding, significant intrathoracic or intra-abdominal injury.  Past medical/surgical history that increases complexity of ED encounter: Hypertension  Interpretation of Diagnostics I personally reviewed the Laboratory Testing and my interpretation is as follows: No significant blood count or electrolyte disturbance.  Imaging notable for intertrochanteric fracture on the right  Patient Reassessment and Ultimate Disposition/Management     Dr. Celena of orthopedics will consult with regard to operative repair.  Plan is for hospitalist admission.  Patient management required discussion with the following services or consulting groups:  Hospitalist Service and Orthopedic Surgery  Complexity of Problems Addressed Acute illness or injury that poses threat of life of bodily function  Additional Data Reviewed and Analyzed Further history obtained from: EMS on arrival  Additional Factors Impacting ED Encounter  Risk Consideration of hospitalization  Ozell HERO. Theadore, MD [facility] Emergency Medicine [facility] [email or web address]  Final Clinical Impressions(s) / ED Diagnoses     ICD-10-CM   1. Closed fracture of trochanter of right femur, initial encounter [facility])  S72.101A       ED Discharge Orders     None        Discharge Instructions Discussed with and Provided to Patient:   Discharge Instructions   None      Theadore Ozell HERO, MD 03/08/24 [phone]  "

## 2024-03-08 NOTE — Transfer of Care (Signed)
 Immediate Anesthesia Transfer of Care Note  Patient: Tyler Macdonald  Procedure(s) Performed: FIXATION, FRACTURE, INTERTROCHANTERIC, WITH INTRAMEDULLARY ROD (Right: Hip)  Patient Location: PACU  Anesthesia Type:General  Level of Consciousness: drowsy and patient cooperative  Airway & Oxygen Therapy: Patient connected to face mask oxygen  Post-op Assessment: Report given to RN, Post -op Vital signs reviewed and stable, and Patient moving all extremities X 4  Post vital signs: Reviewed and stable  Last Vitals:  Vitals Value Taken Time  BP 157/72 03/08/24 12:08  Temp    Pulse 88 03/08/24 12:12  Resp 23 03/08/24 12:12  SpO2 96 % 03/08/24 12:12  Vitals shown include unfiled device data.  Last Pain:  Vitals:   03/08/24 1017  TempSrc:   PainSc: 6       Patients Stated Pain Goal: 3 (03/08/24 1017)  Complications: There were no known notable events for this encounter.

## 2024-03-09 LAB — BASIC METABOLIC PANEL WITH GFR
Anion gap: 8 (ref 5–15)
BUN: 29 mg/dL — ABNORMAL HIGH (ref 8–23)
CO2: 26 mmol/L (ref 22–32)
Calcium: 9.1 mg/dL (ref 8.9–10.3)
Chloride: 102 mmol/L (ref 98–111)
Creatinine, Ser: 1.76 mg/dL — ABNORMAL HIGH (ref 0.61–1.24)
GFR, Estimated: 38 mL/min — ABNORMAL LOW
Glucose, Bld: 133 mg/dL — ABNORMAL HIGH (ref 70–99)
Potassium: 5.3 mmol/L — ABNORMAL HIGH (ref 3.5–5.1)
Sodium: 136 mmol/L (ref 135–145)

## 2024-03-09 LAB — CBC
HCT: 31.6 % — ABNORMAL LOW (ref 39.0–52.0)
Hemoglobin: 10.5 g/dL — ABNORMAL LOW (ref 13.0–17.0)
MCH: 31.6 pg (ref 26.0–34.0)
MCHC: 33.2 g/dL (ref 30.0–36.0)
MCV: 95.2 fL (ref 80.0–100.0)
Platelets: 271 10*3/uL (ref 150–400)
RBC: 3.32 MIL/uL — ABNORMAL LOW (ref 4.22–5.81)
RDW: 13.4 % (ref 11.5–15.5)
WBC: 11.6 10*3/uL — ABNORMAL HIGH (ref 4.0–10.5)
nRBC: 0 % (ref 0.0–0.2)

## 2024-03-09 LAB — VITAMIN D 25 HYDROXY (VIT D DEFICIENCY, FRACTURES): Vit D, 25-Hydroxy: 25.9 ng/mL — ABNORMAL LOW (ref 30–100)

## 2024-03-09 MED ORDER — ENOXAPARIN SODIUM 40 MG/0.4ML IJ SOSY: 40.0000 mg | PREFILLED_SYRINGE | INTRAMUSCULAR | Status: DC

## 2024-03-09 MED ORDER — ENSURE PLUS HIGH PROTEIN PO LIQD
237.0000 mL | Freq: Two times a day (BID) | ORAL | Status: DC
  Administered 2024-03-10 – 2024-03-14 (×9): 237 mL via ORAL

## 2024-03-09 MED ORDER — ADULT MULTIVITAMIN W/MINERALS CH
1.0000 | ORAL_TABLET | Freq: Every day | ORAL | Status: DC
  Administered 2024-03-09 – 2024-03-14 (×6): 1 via ORAL
  Filled 2024-03-09 (×6): qty 1

## 2024-03-09 MED ORDER — ACETAMINOPHEN 500 MG PO TABS
1000.0000 mg | ORAL_TABLET | Freq: Three times a day (TID) | ORAL | Status: DC
  Administered 2024-03-09 – 2024-03-14 (×15): 1000 mg via ORAL
  Filled 2024-03-09 (×16): qty 2

## 2024-03-09 MED ORDER — SENNOSIDES-DOCUSATE SODIUM 8.6-50 MG PO TABS
1.0000 | ORAL_TABLET | Freq: Two times a day (BID) | ORAL | Status: DC
  Administered 2024-03-09 – 2024-03-14 (×7): 1 via ORAL
  Filled 2024-03-09 (×11): qty 1

## 2024-03-09 MED ORDER — ROSUVASTATIN CALCIUM 20 MG PO TABS
40.0000 mg | ORAL_TABLET | Freq: Every day | ORAL | Status: DC
  Administered 2024-03-09 – 2024-03-13 (×5): 40 mg via ORAL
  Filled 2024-03-09 (×2): qty 2
  Filled 2024-03-09: qty 8
  Filled 2024-03-09 (×2): qty 2

## 2024-03-09 MED ORDER — ENOXAPARIN SODIUM 40 MG/0.4ML IJ SOSY
40.0000 mg | PREFILLED_SYRINGE | INTRAMUSCULAR | Status: DC
  Administered 2024-03-09 – 2024-03-14 (×6): 40 mg via SUBCUTANEOUS
  Filled 2024-03-09 (×6): qty 0.4

## 2024-03-09 MED ORDER — HYDROMORPHONE HCL 1 MG/ML IJ SOLN
0.5000 mg | INTRAMUSCULAR | Status: DC | PRN
  Administered 2024-03-09 – 2024-03-10 (×3): 0.5 mg via INTRAVENOUS
  Filled 2024-03-09 (×3): qty 0.5

## 2024-03-09 MED ORDER — APIXABAN 2.5 MG PO TABS: 2.5000 mg | ORAL_TABLET | Freq: Two times a day (BID) | ORAL | 0 refills | Status: AC

## 2024-03-09 MED ORDER — OXYCODONE HCL 5 MG PO TABS: 2.5000 mg | ORAL_TABLET | ORAL | 0 refills | Status: AC | PRN

## 2024-03-09 NOTE — Discharge Instructions (Signed)
 "  Orthopaedic Trauma Service Discharge Instructions   General Discharge Instructions  WEIGHT BEARING STATUS:Weightbearing as tolerated  RANGE OF MOTION/ACTIVITY: unrestricted range of motion  Wound Care: You may remove your surgical dressing on post op day 2. Incisions can be left open to air if there is no drainage. Once the incision is completely dry and without drainage, it may be left open to air out.  Showering may begin post op day 3 (Saturday 03/11/24).  Clean incision gently with soap and water.  DVT/PE prophylaxis: Eliquis  twice daily x 30 days  Diet: as you were eating previously.  Can use over the counter stool softeners and bowel preparations, such as Miralax , to help with bowel movements.  Narcotics can be constipating.  Be sure to drink plenty of fluids  PAIN MEDICATION USE AND EXPECTATIONS  You have likely been given narcotic medications to help control your pain.  After a traumatic event that results in an fracture (broken bone) with or without surgery, it is ok to use narcotic pain medications to help control one's pain.  We understand that everyone responds to pain differently and each individual patient will be evaluated on a regular basis for the continued need for narcotic medications. Ideally, narcotic medication use should last no more than 6-8 weeks (coinciding with fracture healing).   As a patient it is your responsibility as well to monitor narcotic medication use and report the amount and frequency you use these medications when you come to your office visit.   We would also advise that if you are using narcotic medications, you should take a dose prior to therapy to maximize you participation.  IF YOU ARE ON NARCOTIC MEDICATIONS IT IS NOT PERMISSIBLE TO OPERATE A MOTOR VEHICLE (MOTORCYCLE/CAR/TRUCK/MOPED) OR HEAVY MACHINERY DO NOT MIX NARCOTICS WITH OTHER CNS (CENTRAL NERVOUS SYSTEM) DEPRESSANTS SUCH AS ALCOHOL  POST-OPERATIVE OPIOID TAPER INSTRUCTIONS: It is  important to wean off of your opioid medication as soon as possible. If you do not need pain medication after your surgery it is ok to stop day one. Opioids include: Codeine, Hydrocodone(Norco, Vicodin), Oxycodone (Percocet, oxycontin ) and hydromorphone  amongst others.  Long term and even short term use of opiods can cause: Increased pain response Dependence Constipation Depression Respiratory depression And more.  Withdrawal symptoms can include Flu like symptoms Nausea, vomiting And more Techniques to manage these symptoms Hydrate well Eat regular healthy meals Stay active Use relaxation techniques(deep breathing, meditating, yoga) Do Not substitute Alcohol to help with tapering If you have been on opioids for less than two weeks and do not have pain than it is ok to stop all together.  Plan to wean off of opioids This plan should start within one week post op of your fracture surgery  Maintain the same interval or time between taking each dose and first decrease the dose.  Cut the total daily intake of opioids by one tablet each day Next start to increase the time between doses. The last dose that should be eliminated is the evening dose.    STOP SMOKING OR USING NICOTINE PRODUCTS!!!!  As discussed nicotine severely impairs your body's ability to heal surgical and traumatic wounds but also impairs bone healing.  Wounds and bone heal by forming microscopic blood vessels (angiogenesis) and nicotine is a vasoconstrictor (essentially, shrinks blood vessels).  Therefore, if vasoconstriction occurs to these microscopic blood vessels they essentially disappear and are unable to deliver necessary nutrients to the healing tissue.  This is one modifiable factor that you can  do to dramatically increase your chances of healing your injury.  (This means no smoking, no nicotine gum, patches, etc)  DO NOT USE NONSTEROIDAL ANTI-INFLAMMATORY DRUGS (NSAID'S)  Using products such as Advil  (ibuprofen), Aleve (naproxen), Motrin (ibuprofen) for additional pain control during fracture healing can delay and/or prevent the healing response.  If you would like to take over the counter (OTC) medication, Tylenol  (acetaminophen ) is ok.  However, some narcotic medications that are given for pain control contain acetaminophen  as well. Therefore, you should not exceed more than 4000 mg of tylenol  in a day if you do not have liver disease.  Also note that there are may OTC medicines, such as cold medicines and allergy medicines that my contain tylenol  as well.  If you have any questions about medications and/or interactions please ask your doctor/PA or your pharmacist.      ICE AND ELEVATE INJURED/OPERATIVE EXTREMITY  Using ice and elevating the injured extremity above your heart can help with swelling and pain control.  Icing in a pulsatile fashion, such as 20 minutes on and 20 minutes off, can be followed.    Do not place ice directly on skin. Make sure there is a barrier between to skin and the ice pack.    Using frozen items such as frozen peas works well as the conform nicely to the are that needs to be iced.  USE AN ACE WRAP OR TED HOSE FOR SWELLING CONTROL  In addition to icing and elevation, Ace wraps or TED hose are used to help limit and resolve swelling.  It is recommended to use Ace wraps or TED hose until you are informed to stop.    When using Ace Wraps start the wrapping distally (farthest away from the body) and wrap proximally (closer to the body)   Example: If you had surgery on your leg or thing and you do not have a splint on, start the ace wrap at the toes and work your way up to the thigh        If you had surgery on your upper extremity and do not have a splint on, start the ace wrap at your fingers and work your way up to the upper arm   CALL THE OFFICE FOR MEDICATION REFILLS OR WITH ANY QUESTIONS/CONCERNS: [phone]   VISIT OUR WEBSITE FOR ADDITIONAL INFORMATION:  [email or web address]   Discharge Wound Care Instructions  Do NOT apply any ointments, solutions or lotions to pin sites or surgical wounds.  These prevent needed drainage and even though solutions like hydrogen peroxide kill bacteria, they also damage cells lining the pin sites that help fight infection.  Applying lotions or ointments can keep the wounds moist and can cause them to breakdown and open up as well. This can increase the risk for infection. When in doubt call the office.  Surgical incisions should be dressed daily.  If any drainage is noted, use foam dressings - These dressing supplies should be available at local medical supply stores [facility], [facility], etc) as well as insurance claims handler ([employer], [employer], [employer], etc)   Call office for the following: Temperature greater than 101F Persistent nausea and vomiting Severe uncontrolled pain Redness, tenderness, or signs of infection (pain, swelling, redness, odor or green/yellow discharge around the site) Difficulty breathing, headache or visual disturbances Hives Persistent dizziness or light-headedness Extreme fatigue Any other questions or concerns you may have after discharge  In an emergency, call 911 or go to an Emergency Department at a nearby  hospital  OTHER HELPFUL INFORMATION  You should wean off your narcotic medicines as soon as you are able.  Most patients will be off or using minimal narcotics before their first postop appointment.   We suggest you use the pain medication the first night prior to going to bed, in order to ease any pain when the anesthesia wears off. You should avoid taking pain medications on an empty stomach as it will make you nauseous.  Do not drink alcoholic beverages or take illicit drugs when taking pain medications.  In most states it is against the law to drive while you are in a splint or sling.  And certainly against the law to drive while taking narcotics.  You may return  to work/school in the next couple of days when you feel up to it.   Pain medication may make you constipated.  Below are a few solutions to try in this order: Decrease the amount of pain medication if you arent having pain. Drink lots of decaffeinated fluids. Drink prune juice and/or each dried prunes  If the first 3 dont work start with additional solutions Take Colace - an over-the-counter stool softener Take Senokot - an over-the-counter laxative Take Miralax  - a stronger over-the-counter laxative     "

## 2024-03-09 NOTE — Evaluation (Signed)
 Physical Therapy Evaluation Patient Details Name: Tyler Macdonald MRN: [id number] DOB: [birth year] Today's Date: 03/09/2024  History of Present Illness  Pt is a 84 y.o. M who presents 03/08/2024 with a fall and right intertrochanteric femur fx s/p cephalomedullary nailing. Significant PMH: HTN, CVA, chronic dens fx, gout, HLD, CAD, obesity, B knee arthritis.  Clinical Impression  Pt admitted s/p procedure listed above. Pt lives alone with a friend nearby who can assist PRN. Pt uses crutches for household ambulation and electric scooter for community mobility. He is independent with ADL's/IADL's. Pt reports he can live on main floor of home and his home entrance is handicapped accessible. Pt declining pain medication prior to evaluation and reporting 8/10 R hip pain. He presents with RLE proximal weakness and impaired standing balance. Pt requiring mod-maxA + 2 for bed mobility and transitions to standing. Due to difficulty with weight shifting edge of bed with RW, utilized Stedy to transfer to chair. Patient will benefit from intensive inpatient follow-up therapy, >3 hours/day to address deficits and maximize functional mobility.       If plan is discharge home, recommend the following: Two people to help with walking and/or transfers;Two people to help with bathing/dressing/bathroom   Can travel by private vehicle        Equipment Recommendations Other (comment) (defer)  Recommendations for Other Services  Rehab consult    Functional Status Assessment Patient has had a recent decline in their functional status and demonstrates the ability to make significant improvements in function in a reasonable and predictable amount of time.     Precautions / Restrictions Precautions Precautions: Fall Restrictions Weight Bearing Restrictions Per Provider Order: Yes RLE Weight Bearing Per Provider Order: Weight bearing as tolerated      Mobility  Bed Mobility Overal bed mobility: Needs  Assistance Bed Mobility: Supine to Sit     Supine to sit: +2 for physical assistance, Mod assist     General bed mobility comments: cues for technique, assist to support R LE and to raise trunk    Transfers Overall transfer level: Needs assistance Equipment used: Rolling walker (2 wheels), Ambulation equipment used Transfers: Sit to/from Stand Sit to Stand: +2 physical assistance, Max assist, From elevated surface           General transfer comment: cues for hand placement, assist to rise and steady, pt unable to side step, transferred to chair with use of Stedy    Ambulation/Gait                  Stairs            Wheelchair Mobility     Tilt Bed    Modified Rankin (Stroke Patients Only)       Balance Overall balance assessment: Needs assistance   Sitting balance-Leahy Scale: Fair       Standing balance-Leahy Scale: Poor                               Pertinent Vitals/Pain Pain Assessment Pain Assessment: 0-10 Pain Score: 8  Pain Location: R hip Pain Descriptors / Indicators: Aching, Guarding, Grimacing, Moaning Pain Intervention(s): Monitored during session, Repositioned, Patient requesting pain meds-RN notified (pt deferred pain meds prior to session)    Home Living Family/patient expects to be discharged to:: Private residence Living Arrangements: Alone Available Help at Discharge: Friend(s);Available PRN/intermittently Type of Home: House Home Access: Stairs to enter   [employer] of  Steps: 1 (threshold) Alternate Level Stairs-Number of Steps: 12 Home Layout: Multi-level;Able to live on main level with bedroom/bathroom (full bath on main level) Home Equipment: Electric scooter;Crutches;Shower seat;Cane - single Librarian, Academic (2 wheels);Tub bench      Prior Function Prior Level of Function : Independent/Modified Independent;Driving             Mobility Comments: using crutches household  ambulation, electric scooter for community distances ADLs Comments: mod I for ADLs     Extremity/Trunk Assessment   Upper Extremity Assessment Upper Extremity Assessment: Overall WFL for tasks assessed    Lower Extremity Assessment Lower Extremity Assessment: RLE deficits/detail RLE Deficits / Details: R hip fx s/p nail, grossly weak proximally, unable to perform LAQ. ankle DF WFL    Cervical / Trunk Assessment Cervical / Trunk Assessment: Other exceptions (weakness, obesity)  Communication   Communication Communication: No apparent difficulties    Cognition Arousal: Alert Behavior During Therapy: WFL for tasks assessed/performed   PT - Cognitive impairments: No apparent impairments                         Following commands: Intact       Cueing Cueing Techniques: Verbal cues, Gestural cues     General Comments      Exercises     Assessment/Plan    PT Assessment Patient needs continued PT services  PT Problem List Decreased strength;Decreased activity tolerance;Decreased balance;Decreased mobility;Pain;Obesity       PT Treatment Interventions DME instruction;Gait training;Functional mobility training;Therapeutic activities;Therapeutic exercise;Balance training;Patient/family education    PT Goals (Current goals can be found in the Care Plan section)  Acute Rehab PT Goals Patient Stated Goal: to walk PT Goal Formulation: With patient Time For Goal Achievement: 03/23/24 Potential to Achieve Goals: Good    Frequency Min 3X/week     Co-evaluation PT/OT/SLP Co-Evaluation/Treatment: Yes Reason for Co-Treatment: For patient/therapist safety;To address functional/ADL transfers PT goals addressed during session: Mobility/safety with mobility OT goals addressed during session: ADL's and self-care       AM-PAC PT 6 Clicks Mobility  Outcome Measure Help needed turning from your back to your side while in a flat bed without using bedrails?: A  Lot Help needed moving from lying on your back to sitting on the side of a flat bed without using bedrails?: Total Help needed moving to and from a bed to a chair (including a wheelchair)?: Total Help needed standing up from a chair using your arms (e.g., wheelchair or bedside chair)?: Total Help needed to walk in hospital room?: Total Help needed climbing 3-5 steps with a railing? : Total 6 Click Score: 7    End of Session Equipment Utilized During Treatment: Gait belt Activity Tolerance: Patient limited by pain Patient left: in chair;with call bell/phone within reach;with chair alarm set Nurse Communication: Mobility status PT Visit Diagnosis: Pain;Difficulty in walking, not elsewhere classified (R26.2) Pain - Right/Left: Right Pain - part of body: Hip    Time: 9094-9057 PT Time Calculation (min) (ACUTE ONLY): 37 min   Charges:   PT Evaluation $PT Eval Low Complexity: 1 Low   PT General Charges $$ ACUTE PT VISIT: 1 Visit         Aleck Daring, PT, DPT Acute Rehabilitation Services Office [phone]   Aleck ONEIDA Daring 03/09/2024, 12:17 PM

## 2024-03-09 NOTE — Progress Notes (Signed)
 " PROGRESS NOTE  Tyler Macdonald  DOB: 04-17-1940  PCP: Kayla Drivers, MD FMW:[id number]  DOA: 03/08/2024  LOS: 1 day  Hospital Day: 2  Subjective: Patient was seen and examined this morning.   Pleasant elderly Caucasian male.  Sitting up in recliner.  No acute distress.  Pain controlled.  No family at bedside. Afebrile, hemodynamically stable, breathing on room air Labs from this morning showed potassium 5.3, creatinine 1.76, hemoglobin 10.5  Brief narrative: Tyler Macdonald is a 84 y.o. male with PMH significant for obesity, HTN, HLD, CVA, gout.  1/28, patient presented to the ED after a mechanical fall leading to right hip pain.  He reports his leg gave out, he lost balance and fell about 8 steps.  Reports impact to the head but did not lose consciousness.  In the ED, afebrile, hemodynamically stable Labs showed WC count 14.2, hemoglobin 12.7, PN/creatinine 17/1.27 Right hip x-ray showed mildly displaced greater and lesser trochanter fracture CT head unremarkable for acute abnormalities, showed moderate cerebral and cerebellar volume loss, chronic lacunar infarcts CT C-spine did not show acute fracture or dislocation, showed chronic appearing type II dens 2 fracture CT abdomen pelvis showed mildly displaced right intertrochanteric fracture  Consulted orthopedics Admitted to [facility]  Assessment and plan: Closed right hip fracture S/p cephalomedullary nailing Secondary to fall from loss of balance Imaging and procedure as above Orthopedics following Pain regimen --- Scheduled: Tylenol  1 g 3 times daily --- PRN: IV Dilaudid  every 4 hours, oral oxycodone  every 4 hours, Robaxin  500 mg every 8 hours Bowel regimen: Docusate 100 mg twice daily, MiraLAX  as needed DVT prophylaxis: Lovenox  to start from tomorrow  Impaired mobility Fall Skeletal survey as above.  No acute injury to head or cervical spine PT eval  Hypertension PTA meds- Toprol  25 mg daily, losartan  100 mg  daily Continue Toprol .  Keep losartan  on hold IV hydralazine  as needed.  Hyperkalemia AKI on CKD 3 A Baseline creatinine <1.3 from 2024 [facility] system. Labs from this morning showed creatinine elevated to 1.76 and potassium elevated to 5.3 Recent Labs  Lab 03/08/24 0310 03/08/24 1936 03/09/24 0605  NA 137  --  136  K 4.5  --  5.3*  CL 102  --  102  CO2 24  --  26  GLUCOSE 100*  --  133*  BUN 17  --  29*  CREATININE 1.27* 1.75* 1.76*  CALCIUM  9.4  --  9.1   H/o CVA HLD Continue aspirin  and Crestor .  Gout Continue allopurinol   Anxiety/depression On Zoloft  daily at the reduced dose  Nutrition Status: Nutrition Problem: Increased nutrient needs Etiology: hip fracture, post-op healing Signs/Symptoms: estimated needs Interventions: Refer to RD note for recommendations   Mobility:   PT Orders: Active   PT Follow up Rec: Acute Inpatient Rehab (3hours/Day)03/09/2024 1214    Goals of care   Code Status: Full Code     DVT prophylaxis:  enoxaparin  (LOVENOX ) injection 40 mg Start: 03/09/24 1000 SCDs Start: 03/08/24 1434   Antimicrobials: None Fluid: None Consultants: Orthopedics Family Communication: None at bedside  Status: Inpatient Level of care:  Med-Surg   Patient is from: Home Needs to continue in-hospital care: POD 1 Anticipated d/c to: CIR recommended by PT      Diet:  Diet Order             Diet regular Room service appropriate? Yes with Assist; Fluid consistency: Thin  Diet effective now  Scheduled Meds:  acetaminophen   1,000 mg Oral TID   allopurinol   300 mg Oral Daily   aspirin  EC  81 mg Oral Daily   Chlorhexidine  Gluconate Cloth  6 each Topical Daily   enoxaparin  (LOVENOX ) injection  40 mg Subcutaneous Q24H   feeding supplement  237 mL Oral BID BM   metoprolol  succinate  25 mg Oral Daily   multivitamin with minerals  1 tablet Oral Daily   mupirocin  ointment  1 Application Nasal BID   nutrition supplement  (JUVEN)  1 packet Oral BID BM   rosuvastatin   40 mg Oral q1800   senna-docusate  1 tablet Oral BID   sertraline   12.5 mg Oral Daily    PRN meds: diphenhydrAMINE , fluticasone , hydrALAZINE , HYDROmorphone  (DILAUDID ) injection, methocarbamol  **OR** methocarbamol  (ROBAXIN ) injection, oxyCODONE , polyethylene glycol, prochlorperazine    Infusions:    Antimicrobials: Anti-infectives (From admission, onward)    Start     Dose/Rate Route Frequency Ordered Stop   03/08/24 2330  vancomycin  (VANCOCIN ) IVPB 1000 mg/200 mL premix        1,000 mg 200 mL/hr over 60 Minutes Intravenous Every 12 hours 03/08/24 1433 03/09/24 0819   03/08/24 1034  vancomycin  (VANCOCIN ) powder  Status:  Discontinued          As needed 03/08/24 1035 03/08/24 1203   03/08/24 1015  levofloxacin  (LEVAQUIN ) IVPB 500 mg        500 mg 100 mL/hr over 60 Minutes Intravenous On call to O.R. 03/08/24 1013 03/08/24 1222   03/08/24 1015  vancomycin  (VANCOREADY) IVPB 1500 mg/300 mL        1,500 mg 150 mL/hr over 120 Minutes Intravenous On call to O.R. 03/08/24 1013 03/09/24 0819       Objective: Vitals:   03/09/24 0352 03/09/24 0758  BP: (!) 140/75 (!) 115/57  Pulse: 70 78  Resp: 17 19  Temp: 98.6 F (37 C) 98.7 F (37.1 C)  SpO2: (!) 89% 92%    Intake/Output Summary (Last 24 hours) at 03/09/2024 1332 Last data filed at 03/09/2024 0641 Gross per 24 hour  Intake 100 ml  Output 440 ml  Net -340 ml   Filed Weights   03/08/24 0958  Weight: 122.5 kg   Weight change:  Body mass index is 30.42 kg/m.   Physical Exam: General exam: Pleasant, elderly Caucasian male.  Not in distress Skin: No rashes, lesions or ulcers. HEENT: Atraumatic, normocephalic, no obvious bleeding Lungs: Clear to auscultation bilaterally,  CVS: S1, S2, no murmur,   GI/Abd: Soft, nontender, nondistended, bowel sound present,   CNS: Alert, awake, oriented x 3 Psychiatry: Mood appropriate Extremities: No pedal edema, no calf tenderness,    Data Review: I have personally reviewed the laboratory data and studies available.  F/u labs ordered Unresulted Labs (From admission, onward)     Start     Ordered   03/15/24 0500  Creatinine, serum  (enoxaparin  (LOVENOX )    CrCl >/= 30 ml/min)  Weekly,   R     Comments: while on enoxaparin  therapy    03/08/24 1433   03/15/24 0500  Creatinine, serum  (enoxaparin  (LOVENOX )    CrCl >/= 30 ml/min)  Weekly,   R     Comments: while on enoxaparin  therapy    03/08/24 1433   03/09/24 0500  CBC  Daily,   R      03/08/24 1433            Signed, Chapman Rota, MD [facility] 03/09/2024  "

## 2024-03-09 NOTE — TOC CAGE-AID Note (Signed)
 Transition of Care [facility]) - CAGE-AID Screening   Patient Details  Name: NELDON SHEPARD MRN: [id number] Date of Birth: [birth year]  [facility]) CM/SW Contact:    Clifford Benninger E Nikolaj Geraghty, LCSW Phone Number: 03/09/2024, 9:05 AM   Clinical Narrative:    CAGE-AID Screening:    Have You Ever Felt You Ought to Cut Down on Your Drinking or Drug Use?: No Have People Annoyed You By [employer] Your Drinking Or Drug Use?: No Have You Felt Bad Or Guilty About Your Drinking Or Drug Use?: No Have You Ever Had a Drink or Used Drugs First Thing In The Morning to Steady Your Nerves or to Get Rid of a Hangover?: No CAGE-AID Score: 0  Substance Abuse Education Offered: No

## 2024-03-09 NOTE — Evaluation (Signed)
 Occupational Therapy Evaluation Patient Details Name: Tyler Macdonald MRN: [id number] DOB: September 25, 1940 Today's Date: 03/09/2024   History of Present Illness   Pt is a 84 y.o. M who presents 03/08/2024 with a fall and right intertrochanteric femur fx s/p cephalomedullary nailing. Significant PMH: HTN, CVA, chronic dens fx, gout, HLD, CAD, obesity, B knee arthritis.     Clinical Impressions Pt declined pain medication prior to evaluation. Pt lives alone with a friend nearby who can assist PRN. He uses crutches inside his home and an electric scooter outside. He is modified independent in ADLs and IADLs. He reports he can stay on the first level of his home if necessary and it has a full bathroom. Pt presents with 8/10 R hip pain, generalized weakness and inability to take steps with RW this visit, ultimately transferred with use of stedy to chair. Pt requires +2 assist for all mobility and set up to total assist for ADLs. Agreed to take pain meds at end of session, RN notified. Patient will benefit from intensive inpatient follow-up therapy, >3 hours/day.     If plan is discharge home, recommend the following:   Two people to help with walking and/or transfers;Two people to help with bathing/dressing/bathroom;Assistance with cooking/housework;Assist for transportation;Help with stairs or ramp for entrance     Functional Status Assessment   Patient has had a recent decline in their functional status and demonstrates the ability to make significant improvements in function in a reasonable and predictable amount of time.     Equipment Recommendations   Other (comment) (defer)     Recommendations for Other Services   Rehab consult     Precautions/Restrictions   Precautions Precautions: Fall Restrictions Weight Bearing Restrictions Per Provider Order: Yes RLE Weight Bearing Per Provider Order: Weight bearing as tolerated     Mobility Bed Mobility Overal bed mobility: Needs  Assistance Bed Mobility: Supine to Sit     Supine to sit: +2 for physical assistance, Mod assist     General bed mobility comments: cues for technique, assist to support R LE and to raise trunk    Transfers Overall transfer level: Needs assistance Equipment used: Rolling walker (2 wheels), Ambulation equipment used Transfers: Sit to/from Stand Sit to Stand: +2 physical assistance, Max assist, From elevated surface           General transfer comment: cues for hand placement, assist to rise and steady, pt unable to side step, transferred to chair with use of Stedy      Balance Overall balance assessment: Needs assistance   Sitting balance-Leahy Scale: Fair       Standing balance-Leahy Scale: Poor                             ADL either performed or assessed with clinical judgement   ADL Overall ADL's : Needs assistance/impaired Eating/Feeding: Independent;Bed level   Grooming: Wash/dry hands;Wash/dry face;Sitting;Set up   Upper Body Bathing: Minimal assistance;Sitting   Lower Body Bathing: +2 for physical assistance;Total assistance;Sit to/from stand   Upper Body Dressing : Minimal assistance;Sitting   Lower Body Dressing: Bed level;Moderate assistance Lower Body Dressing Details (indicate cue type and reason): pt requesting to wear shorts, donned in bed with assist to start over feet, pt rolled side to side to pull up     Toileting- Clothing Manipulation and Hygiene: +2 for physical assistance;Total assistance;Sit to/from stand;Bed level  Vision Ability to See in Adequate Light: 0 Adequate Patient Visual Report: No change from baseline       Perception         Praxis         Pertinent Vitals/Pain Pain Assessment Pain Assessment: 0-10 Pain Score: 8  Pain Location: R hip Pain Descriptors / Indicators: Aching, Guarding, Grimacing, Moaning Pain Intervention(s): Monitored during session, Repositioned, Patient requesting  pain meds-RN notified     Extremity/Trunk Assessment Upper Extremity Assessment Upper Extremity Assessment: Overall WFL for tasks assessed;Right hand dominant   Lower Extremity Assessment Lower Extremity Assessment: Defer to PT evaluation   Cervical / Trunk Assessment Cervical / Trunk Assessment: Other exceptions (weakness, obesity)   Communication Communication Communication: No apparent difficulties   Cognition Arousal: Alert Behavior During Therapy: WFL for tasks assessed/performed Cognition: No apparent impairments             OT - Cognition Comments: tangential comments, declined pain meds prior to therapy, agreeable at end of session                 Following commands: Intact       Cueing  General Comments   Cueing Techniques: Verbal cues;Gestural cues      Exercises     Shoulder Instructions      Home Living Family/patient expects to be discharged to:: Private residence Living Arrangements: Alone Available Help at Discharge: Friend(s);Available PRN/intermittently Type of Home: House Home Access: Stairs to enter Entrance Stairs-Number of Steps: 1 (threshold)   Home Layout: Multi-level;Able to live on main level with bedroom/bathroom (full bath on main level) Alternate Level Stairs-Number of Steps: 12   Bathroom Shower/Tub: Chief Strategy Officer: Standard     Home Equipment: Development Worker, Community;Shower seat;Cane - single Librarian, Academic (2 wheels)          Prior Functioning/Environment Prior Level of Function : Independent/Modified Independent;Driving             Mobility Comments: using crutches household ambulation, electric scooter for community distances ADLs Comments: mod I for ADLs    OT Problem List: Decreased strength;Impaired balance (sitting and/or standing);Pain;Decreased knowledge of use of DME or AE   OT Treatment/Interventions: Self-care/ADL training;DME and/or AE instruction;Therapeutic  activities;Patient/family education;Balance training      OT Goals(Current goals can be found in the care plan section)   Acute Rehab OT Goals OT Goal Formulation: With patient Time For Goal Achievement: 03/23/24 Potential to Achieve Goals: Good ADL Goals Pt Will Perform Grooming: with min assist;standing Pt Will Perform Lower Body Bathing: with min assist;sit to/from stand;with adaptive equipment Pt Will Transfer to Toilet: with min assist;ambulating;bedside commode Pt Will Perform Toileting - Clothing Manipulation and hygiene: with min assist;sit to/from stand Additional ADL Goal #1: Pt will complete bed mobility with min assist in preparation for ADLs.   OT Frequency:  Min 2X/week    Co-evaluation PT/OT/SLP Co-Evaluation/Treatment: Yes Reason for Co-Treatment: For patient/therapist safety;To address functional/ADL transfers PT goals addressed during session: Mobility/safety with mobility OT goals addressed during session: ADL's and self-care      AM-PAC OT 6 Clicks Daily Activity     Outcome Measure Help from another person eating meals?: None Help from another person taking care of personal grooming?: A Little Help from another person toileting, which includes using toliet, bedpan, or urinal?: Total Help from another person bathing (including washing, rinsing, drying)?: A Lot Help from another person to put on and taking off regular upper body clothing?: A Little  Help from another person to put on and taking off regular lower body clothing?: Total 6 Click Score: 14   End of Session Nurse Communication: Mobility status;Patient requests pain meds;Need for lift equipment  Activity Tolerance: Patient limited by pain Patient left: in chair;with call bell/phone within reach;with chair alarm set  OT Visit Diagnosis: Unsteadiness on feet (R26.81);Pain;History of falling (Z91.81);Muscle weakness (generalized) (M62.81)                Time: 9096-9057 OT Time Calculation (min):  39 min Charges:  OT General Charges $OT Visit: 1 Visit OT Evaluation $OT Eval Moderate Complexity: 1 Mod Mliss HERO, OTR/L Acute Rehabilitation Services Office: [phone]  Kennth Mliss Helling 03/09/2024, 11:03 AM

## 2024-03-09 NOTE — Progress Notes (Signed)
 Orthopaedic Trauma Progress Note  SUBJECTIVE: Doing okay this morning.  Pain controlled at rest.  Is been working on some knee and ankle range of motion on his own in bed.  Has not been up out of bed yet since surgery.  Does note discomfort in the hip with abduction or external rotation of the hip.  We discussed this is normal given his injury and surgery.  No chest pain. No SOB. No nausea/vomiting. No other complaints.  Patient lives alone at baseline, is agreeable to SNF if needed  OBJECTIVE:  Vitals:   03/09/24 0352 03/09/24 0758  BP: (!) 140/75 (!) 115/57  Pulse: 70 78  Resp: 17 19  Temp: 98.6 F (37 C) 98.7 F (37.1 C)  SpO2: (!) 89% 92%    Opiates Today (MME): Today's  total administered Morphine  Milligram Equivalents: 0 Opiates Yesterday (MME): Yesterday's total administered Morphine  Milligram Equivalents: 42  General: Sitting up in bed, no acute distress Respiratory: No increased work of breathing.  Operative Extremity (RLE): Dressings clean, dry, intact.  Some soreness with palpation over the hip and throughout both proximal and lateral thigh as expected.  Otherwise no significant tenderness to the distal thigh or lower leg.  Ankle DF/PF intact.  No significant calf tenderness.  Endorses sensation light touch over all aspects of the foot. + DP pulse  IMAGING: Stable post op imaging right hip  LABS:  Results for orders placed or performed during the hospital encounter of 03/08/24 (from the past 24 hours)  Surgical pcr screen     Status: Abnormal   Collection Time: 03/08/24 10:14 AM   Specimen: Nasal Mucosa; Nasal Swab  Result Value Ref Range   MRSA, PCR POSITIVE (A) NEGATIVE   Staphylococcus aureus POSITIVE (A) NEGATIVE  CBC     Status: Abnormal   Collection Time: 03/08/24  7:36 PM  Result Value Ref Range   WBC 8.5 4.0 - 10.5 K/uL   RBC 3.56 (L) 4.22 - 5.81 MIL/uL   Hemoglobin 11.2 (L) 13.0 - 17.0 g/dL   HCT 66.1 (L) 60.9 - 47.9 %   MCV 94.9 80.0 - 100.0 fL   MCH 31.5  26.0 - 34.0 pg   MCHC 33.1 30.0 - 36.0 g/dL   RDW 86.5 88.4 - 84.4 %   Platelets 294 150 - 400 K/uL   nRBC 0.0 0.0 - 0.2 %  Creatinine, serum     Status: Abnormal   Collection Time: 03/08/24  7:36 PM  Result Value Ref Range   Creatinine, Ser 1.75 (H) 0.61 - 1.24 mg/dL   GFR, Estimated 39 (L) >60 mL/min  Basic metabolic panel     Status: Abnormal   Collection Time: 03/09/24  6:05 AM  Result Value Ref Range   Sodium 136 135 - 145 mmol/L   Potassium 5.3 (H) 3.5 - 5.1 mmol/L   Chloride 102 98 - 111 mmol/L   CO2 26 22 - 32 mmol/L   Glucose, Bld 133 (H) 70 - 99 mg/dL   BUN 29 (H) 8 - 23 mg/dL   Creatinine, Ser 8.23 (H) 0.61 - 1.24 mg/dL   Calcium  9.1 8.9 - 10.3 mg/dL   GFR, Estimated 38 (L) >60 mL/min   Anion gap 8 5 - 15  CBC     Status: Abnormal   Collection Time: 03/09/24  6:05 AM  Result Value Ref Range   WBC 11.6 (H) 4.0 - 10.5 K/uL   RBC 3.32 (L) 4.22 - 5.81 MIL/uL   Hemoglobin 10.5 (L) 13.0 -  17.0 g/dL   HCT 68.3 (L) 60.9 - 47.9 %   MCV 95.2 80.0 - 100.0 fL   MCH 31.6 26.0 - 34.0 pg   MCHC 33.2 30.0 - 36.0 g/dL   RDW 86.5 88.4 - 84.4 %   Platelets 271 150 - 400 K/uL   nRBC 0.0 0.0 - 0.2 %    ASSESSMENT: Tyler Macdonald is a 84 y.o. male, 1 Day Post-Op s/p fall down stairs  Procedures: CEPHALOMEDULLARY NAILING RIGHT INTERTROCHANTERIC FEMUR FRACTURE  CV/Blood loss: Acute blood loss anemia, Hgb 10.5 this AM. Hemodynamically stable  PLAN: Weightbearing: WBAT RLE ROM: Unrestricted ROM Incisional and dressing care: Reinforce dressings as needed  Showering: Okay to be getting incisions wet starting 03/11/2024 Orthopedic device(s): None  Pain management:  1. Tylenol  1000 mg 3 times daily  2. Robaxin  500 mg q 8 hours PRN 3. Oxycodone  2.5-5 mg q 4 hours PRN 4. Dilaudid  0.5 mg q 4 hours PRN VTE prophylaxis: Lovenox , SCDs ID: Vancomycin  post op Foley/Lines:  No foley, KVO IVFs Impediments to Fracture Healing: Vitamin D level pending, supplementation as indicated Dispo:  PT/OT evaluation today, dispo pending.  Patient will likely require SNF at discharge as he lives alone.  Ortho issues stable.  Okay for discharge from ortho standpoint once cleared by medicine team and therapies  D/C recommendations: - Oxycodone , Robaxin , Tylenol  for pain control - Eliquis  2.5 mg twice daily x 30 days for DVT prophylaxis - Possible need for Vit D supplementation  Follow - up plan: 2 weeks after d/c for wound check and repeat x-rays   Contact information:  Franky Light MD, Lauraine Moores PA-C. After hours and holidays please check [email or web address] for group call information for [facility]   Lauraine PATRIC Moores, PA-C [phone] (office) [email or web address]

## 2024-03-09 NOTE — Plan of Care (Signed)
" °  Problem: Clinical Measurements: Goal: Will remain free from infection Outcome: Not Progressing   Problem: Activity: Goal: Risk for activity intolerance will decrease Outcome: Not Progressing   Problem: Safety: Goal: Ability to remain free from injury will improve Outcome: Not Progressing   Problem: Pain Managment: Goal: General experience of comfort will improve and/or be controlled Outcome: Not Progressing   "

## 2024-03-09 NOTE — Plan of Care (Signed)
 Patient is calm and coping well.   Problem: Clinical Measurements: Goal: Will remain free from infection Outcome: Progressing   Problem: Activity: Goal: Risk for activity intolerance will decrease Outcome: Progressing   Problem: Coping: Problem: Skin Integrity: Goal: Demonstrates signs of wound healing without infection Outcome: Progressing   Goal: Level of anxiety will decrease Outcome: Progressing   Problem: Pain Managment: Goal: General experience of comfort will improve and/or be controlled Outcome: Progressing   Problem: Safety: Goal: Ability to remain free from injury will improve Outcome: Progressing   Problem: Skin Integrity: Goal: Risk for impaired skin integrity will decrease Outcome: Progressing

## 2024-03-09 NOTE — Anesthesia Postprocedure Evaluation (Signed)
"   Anesthesia Post Note  Patient: Tyler Macdonald  Procedure(s) Performed: FIXATION, FRACTURE, INTERTROCHANTERIC, WITH INTRAMEDULLARY ROD (Right: Hip)     Patient location during evaluation: PACU Anesthesia Type: General Level of consciousness: awake Pain management: pain level controlled Vital Signs Assessment: post-procedure vital signs reviewed and stable Respiratory status: spontaneous breathing, nonlabored ventilation and respiratory function stable Cardiovascular status: blood pressure returned to baseline and stable Postop Assessment: no apparent nausea or vomiting Anesthetic complications: no   There were no known notable events for this encounter.  Last Vitals:  Vitals:   03/08/24 2001 03/09/24 0352  BP: 124/75 (!) 140/75  Pulse: 77 70  Resp: 17 17  Temp: 36.8 C 37 C  SpO2: 90% (!) 89%    Last Pain:  Vitals:   03/09/24 0352  TempSrc: Oral  PainSc:                  [name] P [name]      "

## 2024-03-10 ENCOUNTER — Encounter (HOSPITAL_COMMUNITY): Payer: Self-pay | Admitting: Student

## 2024-03-10 LAB — CBC
HCT: 28.8 % — ABNORMAL LOW (ref 39.0–52.0)
Hemoglobin: 9.6 g/dL — ABNORMAL LOW (ref 13.0–17.0)
MCH: 31.7 pg (ref 26.0–34.0)
MCHC: 33.3 g/dL (ref 30.0–36.0)
MCV: 95 fL (ref 80.0–100.0)
Platelets: 254 10*3/uL (ref 150–400)
RBC: 3.03 MIL/uL — ABNORMAL LOW (ref 4.22–5.81)
RDW: 13.9 % (ref 11.5–15.5)
WBC: 10.9 10*3/uL — ABNORMAL HIGH (ref 4.0–10.5)
nRBC: 0 % (ref 0.0–0.2)

## 2024-03-10 LAB — GLUCOSE, CAPILLARY
Glucose-Capillary: 149 mg/dL — ABNORMAL HIGH (ref 70–99)
Glucose-Capillary: 127 mg/dL — ABNORMAL HIGH (ref 70–99)
Glucose-Capillary: 123 mg/dL — ABNORMAL HIGH (ref 70–99)

## 2024-03-10 MED ORDER — VITAMIN D (ERGOCALCIFEROL) 1.25 MG (50000 UNIT) PO CAPS
50000.0000 [IU] | ORAL_CAPSULE | ORAL | Status: DC
  Administered 2024-03-10: 50000 [IU] via ORAL
  Filled 2024-03-10: qty 1

## 2024-03-10 NOTE — Plan of Care (Signed)
 A relative visited, patient calm.   Problem: Health Behavior/Discharge Planning: Goal: Ability to manage health-related needs will improve Outcome: Progressing   Problem: Clinical Measurements: Goal: Will remain free from infection Outcome: Progressing   Problem: Activity: Goal: Risk for activity intolerance will decrease Outcome: Progressing   Problem: Coping: Goal: Level of anxiety will decrease Outcome: Progressing   Problem: Pain Managment: Goal: General experience of comfort will improve and/or be controlled Outcome: Progressing   Problem: Safety: Goal: Ability to remain free from injury will improve Outcome: Progressing   Problem: Skin Integrity: Goal: Risk for impaired skin integrity will decrease Outcome: Progressing

## 2024-03-10 NOTE — Progress Notes (Signed)
 " PROGRESS NOTE  Tyler Macdonald  DOB: 1941-01-18  PCP: Kayla Drivers, MD FMW:[id number]  DOA: 03/08/2024  LOS: 2 days  Hospital Day: 3  Subjective: Patient was seen and examined this afternoon. Pleasant elderly Caucasian male.  Sitting up in recliner.  Not in distress. Has not had a BM after surgery. Pain controlled. Pending CIR.  Brief narrative: Tyler Macdonald is a 84 y.o. male with PMH significant for obesity, HTN, HLD, CVA, gout.  1/28, patient presented to the ED after a mechanical fall leading to right hip pain.  He reports his leg gave out, he lost balance and fell about 8 steps.  Reports impact to the head but did not lose consciousness.  In the ED, afebrile, hemodynamically stable Labs showed WC count 14.2, hemoglobin 12.7, PN/creatinine 17/1.27 Right hip x-ray showed mildly displaced greater and lesser trochanter fracture CT head unremarkable for acute abnormalities, showed moderate cerebral and cerebellar volume loss, chronic lacunar infarcts CT C-spine did not show acute fracture or dislocation, showed chronic appearing type II dens 2 fracture CT abdomen pelvis showed mildly displaced right intertrochanteric fracture  Consulted orthopedics Admitted to [facility]  Assessment and plan: Closed right hip fracture S/p cephalomedullary nailing Secondary to fall from loss of balance Imaging and procedure as above Orthopedics following Pain regimen --- Scheduled: Tylenol  1 g 3 times daily --- PRN: IV Dilaudid  every 4 hours, oral oxycodone  every 4 hours, Robaxin  500 mg every 8 hours Bowel regimen: Docusate 100 mg twice daily, MiraLAX  as needed.  Has not had a BM after surgery DVT prophylaxis: Lovenox   Impaired mobility Fall Skeletal survey as above.  No acute injury to head or cervical spine PT eval  Hypertension PTA meds- Toprol  25 mg daily, losartan  100 mg daily.  But it seems patient is not aware what medicine he was actually taking Currently blood pressure and heart  rate are controlled on Toprol  alone.  Continue to hold losartan . IV hydralazine  as needed.  Hyperkalemia AKI on CKD 3 A Baseline creatinine <1.3 from 2024 [facility]. Labs from this morning showed creatinine elevated to 1.76 and potassium elevated to 5.3. Recent Labs  Lab 03/08/24 0310 03/08/24 1936 03/09/24 0605  NA 137  --  136  K 4.5  --  5.3*  CL 102  --  102  CO2 24  --  26  GLUCOSE 100*  --  133*  BUN 17  --  29*  CREATININE 1.27* 1.75* 1.76*  CALCIUM  9.4  --  9.1   H/o CVA HLD Continue aspirin  and Crestor .  Gout Continue allopurinol   Anxiety/depression Patient states he has not been taking Zoloft  recently.  I stopped it today.  Nutrition Status: Nutrition Problem: Increased nutrient needs Etiology: hip fracture, post-op healing Signs/Symptoms: estimated needs Interventions: Refer to RD note for recommendations   Mobility:   PT Orders: Active   PT Follow up Rec: Acute Inpatient Rehab (3hours/Day)03/10/2024 1041    Goals of care   Code Status: Full Code     DVT prophylaxis:  enoxaparin  (LOVENOX ) injection 40 mg Start: 03/09/24 1000 SCDs Start: 03/08/24 1434   Antimicrobials: None Fluid: None Consultants: Orthopedics Family Communication: None at bedside  Status: Inpatient Level of care:  Med-Surg   Patient is from: Home Needs to continue in-hospital care: POD 2.  Medically stable Anticipated d/c to: CIR recommended by PT      Diet:  Diet Order             Diet regular Room service  appropriate? Yes with Assist; Fluid consistency: Thin  Diet effective now                   Scheduled Meds:  acetaminophen   1,000 mg Oral TID   allopurinol   300 mg Oral Daily   aspirin  EC  81 mg Oral Daily   Chlorhexidine  Gluconate Cloth  6 each Topical Daily   enoxaparin  (LOVENOX ) injection  40 mg Subcutaneous Q24H   feeding supplement  237 mL Oral BID BM   metoprolol  succinate  25 mg Oral Daily   multivitamin with minerals  1 tablet Oral Daily    mupirocin  ointment  1 Application Nasal BID   nutrition supplement (JUVEN)  1 packet Oral BID BM   rosuvastatin   40 mg Oral q1800   senna-docusate  1 tablet Oral BID   Vitamin D  (Ergocalciferol )  50,000 Units Oral Q7 days    PRN meds: diphenhydrAMINE , fluticasone , hydrALAZINE , HYDROmorphone  (DILAUDID ) injection, methocarbamol  **OR** methocarbamol  (ROBAXIN ) injection, oxyCODONE , polyethylene glycol, prochlorperazine    Infusions:    Antimicrobials: Anti-infectives (From admission, onward)    Start     Dose/Rate Route Frequency Ordered Stop   03/08/24 2330  vancomycin  (VANCOCIN ) IVPB 1000 mg/200 mL premix        1,000 mg 200 mL/hr over 60 Minutes Intravenous Every 12 hours 03/08/24 1433 03/09/24 0819   03/08/24 1034  vancomycin  (VANCOCIN ) powder  Status:  Discontinued          As needed 03/08/24 1035 03/08/24 1203   03/08/24 1015  levofloxacin  (LEVAQUIN ) IVPB 500 mg        500 mg 100 mL/hr over 60 Minutes Intravenous On call to O.R. 03/08/24 1013 03/08/24 1222   03/08/24 1015  vancomycin  (VANCOREADY) IVPB 1500 mg/300 mL        1,500 mg 150 mL/hr over 120 Minutes Intravenous On call to O.R. 03/08/24 1013 03/09/24 0819       Objective: Vitals:   03/10/24 0809 03/10/24 1411  BP: 120/64 (!) 117/58  Pulse: (!) 57 89  Resp: 16 17  Temp: 98.4 F (36.9 C) 97.8 F (36.6 C)  SpO2: 97% 95%    Intake/Output Summary (Last 24 hours) at 03/10/2024 1441 Last data filed at 03/10/2024 1034 Gross per 24 hour  Intake 240 ml  Output 150 ml  Net 90 ml   Filed Weights   03/08/24 0958  Weight: 122.5 kg   Weight change:  Body mass index is 30.42 kg/m.   Physical Exam: General exam: Pleasant, elderly Caucasian male.  Not in distress Skin: No rashes, lesions or ulcers. HEENT: Atraumatic, normocephalic, no obvious bleeding Lungs: Clear to auscultation bilaterally,  CVS: S1, S2, no murmur,   GI/Abd: Soft, nontender, nondistended, bowel sound present,   CNS: Alert, awake, oriented x  3 Psychiatry: Mood appropriate Extremities: No pedal edema, no calf tenderness,   Data Review: I have personally reviewed the laboratory data and studies available.  F/u labs ordered Unresulted Labs (From admission, onward)     Start     Ordered   03/15/24 0500  Creatinine, serum  (enoxaparin  (LOVENOX )    CrCl >/= 30 ml/min)  Weekly,   R     Comments: while on enoxaparin  therapy    03/08/24 1433   03/15/24 0500  Creatinine, serum  (enoxaparin  (LOVENOX )    CrCl >/= 30 ml/min)  Weekly,   R     Comments: while on enoxaparin  therapy    03/08/24 1433  Signed, Chapman Rota, MD [facility] 03/10/2024  "

## 2024-03-10 NOTE — Progress Notes (Signed)
 Physical Therapy Treatment Patient Details Name: Tyler Macdonald MRN: [id number] DOB: Jun 01, 1940 Today's Date: 03/10/2024   History of Present Illness Pt is a 84 y.o. M who presents 03/08/2024 with a fall and right intertrochanteric femur fx s/p cephalomedullary nailing. Significant PMH: HTN, CVA, chronic dens fx, gout, HLD, CAD, obesity, B knee arthritis.    PT Comments  Pt pleasant and motivated to participate. Pt with improving right lower extremity strength, although pain remains a limitation. Pt able to perform AROM RLE therapeutic exercises at bed/chair level. Pt demonstrates increased independence with transition to edge of bed. Utilized Stedy to transfer from bed to chair. Patient will benefit from intensive inpatient follow-up therapy, >3 hours/day.    If plan is discharge home, recommend the following: Two people to help with walking and/or transfers;Two people to help with bathing/dressing/bathroom   Can travel by private vehicle        Equipment Recommendations  Other (comment) (defer)    Recommendations for Other Services Rehab consult     Precautions / Restrictions Precautions Precautions: Fall Restrictions Weight Bearing Restrictions Per Provider Order: Yes RLE Weight Bearing Per Provider Order: Weight bearing as tolerated     Mobility  Bed Mobility Overal bed mobility: Needs Assistance Bed Mobility: Supine to Sit     Supine to sit: Contact guard     General bed mobility comments: Increased time/effort, no physical assist required    Transfers Overall transfer level: Needs assistance Equipment used: Ambulation equipment used Transfers: Sit to/from Stand Sit to Stand: Min assist, From elevated surface           General transfer comment: Utilized Stedy to stand from elevated bed height    Ambulation/Gait                   Stairs             Wheelchair Mobility     Tilt Bed    Modified Rankin (Stroke Patients Only)        Balance Overall balance assessment: Needs assistance   Sitting balance-Leahy Scale: Fair                                      Hotel Manager: No apparent difficulties  Cognition Arousal: Alert Behavior During Therapy: WFL for tasks assessed/performed   PT - Cognitive impairments: No apparent impairments                         Following commands: Intact      Cueing Cueing Techniques: Verbal cues, Gestural cues  Exercises General Exercises - Lower Extremity Ankle Circles/Pumps: AROM, Both, 20 reps, Supine Quad Sets: AROM, Right, 10 reps, Supine Long Arc Quad: AROM, Both, 10 reps, Seated    General Comments        Pertinent Vitals/Pain Pain Assessment Pain Assessment: Faces Faces Pain Scale: Hurts whole lot Pain Location: R hip Pain Descriptors / Indicators: Aching, Guarding, Grimacing, Moaning Pain Intervention(s): Limited activity within patient's tolerance, Monitored during session, Premedicated before session    Home Living                          Prior Function            PT Goals (current goals can now be found in the care plan section) Acute Rehab PT Goals Patient  Stated Goal: to walk PT Goal Formulation: With patient Time For Goal Achievement: 03/23/24 Potential to Achieve Goals: Good Progress towards PT goals: Progressing toward goals    Frequency    Min 3X/week      PT Plan      Co-evaluation              AM-PAC PT 6 Clicks Mobility   Outcome Measure  Help needed turning from your back to your side while in a flat bed without using bedrails?: A Little Help needed moving from lying on your back to sitting on the side of a flat bed without using bedrails?: A Little Help needed moving to and from a bed to a chair (including a wheelchair)?: Total Help needed standing up from a chair using your arms (e.g., wheelchair or bedside chair)?: Total Help needed to walk in  hospital room?: Total Help needed climbing 3-5 steps with a railing? : Total 6 Click Score: 10    End of Session Equipment Utilized During Treatment: Gait belt Activity Tolerance: Patient limited by pain Patient left: in chair;with call bell/phone within reach;with chair alarm set Nurse Communication: Mobility status PT Visit Diagnosis: Pain;Difficulty in walking, not elsewhere classified (R26.2) Pain - Right/Left: Right Pain - part of body: Hip     Time: 0920-0950 PT Time Calculation (min) (ACUTE ONLY): 30 min  Charges:    $Therapeutic Activity: 23-37 mins PT General Charges $$ ACUTE PT VISIT: 1 Visit                     Aleck Daring, PT, DPT Acute Rehabilitation Services Office [phone]    Aleck ONEIDA Daring 03/10/2024, 10:45 AM

## 2024-03-10 NOTE — Progress Notes (Signed)
 Orthopaedic Trauma Progress Note  SUBJECTIVE: Doing fairly well this morning.  Pain controlled at rest, does increase some with activity as expected.  Medications have been helpful.  Was able to mobilize to bedside chair.  No chest pain. No SOB. No nausea/vomiting. No other complaints.   OBJECTIVE:  Vitals:   03/10/24 0507 03/10/24 0809  BP: 119/72 120/64  Pulse: 61 (!) 57  Resp: 15 16  Temp: 97.7 F (36.5 C) 98.4 F (36.9 C)  SpO2: 94% 97%    Opiates Today (MME): Today's  total administered Morphine  Milligram Equivalents: 10 Opiates Yesterday (MME): Yesterday's total administered Morphine  Milligram Equivalents: 20  General: Sitting up in bedside chair, no acute distress Respiratory: No increased work of breathing.  Operative Extremity (RLE): Dressings clean, dry, intact.  Some soreness with palpation over the hip and throughout both proximal and lateral thigh as expected.  Otherwise no significant tenderness to the distal thigh or lower leg.  Ankle DF/PF intact.  No significant calf tenderness.  Endorses sensation light touch over all aspects of the foot. + DP pulse  IMAGING: Stable post op imaging right hip  LABS:  Results for orders placed or performed during the hospital encounter of 03/08/24 (from the past 24 hours)  CBC     Status: Abnormal   Collection Time: 03/10/24  6:07 AM  Result Value Ref Range   WBC 10.9 (H) 4.0 - 10.5 K/uL   RBC 3.03 (L) 4.22 - 5.81 MIL/uL   Hemoglobin 9.6 (L) 13.0 - 17.0 g/dL   HCT 71.1 (L) 60.9 - 47.9 %   MCV 95.0 80.0 - 100.0 fL   MCH 31.7 26.0 - 34.0 pg   MCHC 33.3 30.0 - 36.0 g/dL   RDW 86.0 88.4 - 84.4 %   Platelets 254 150 - 400 K/uL   nRBC 0.0 0.0 - 0.2 %    ASSESSMENT: Tyler Macdonald is a 84 y.o. male, 2 Days Post-Op s/p fall down stairs  Procedures: CEPHALOMEDULLARY NAILING RIGHT INTERTROCHANTERIC FEMUR FRACTURE  CV/Blood loss: Acute blood loss anemia, Hgb 9.6 this AM. Hemodynamically stable  PLAN: Weightbearing: WBAT  RLE ROM: Unrestricted ROM Incisional and dressing care: Ok to remove dressings and leave incisions open to air Showering: Okay to be getting incisions wet starting 03/11/2024 Orthopedic device(s): None  Pain management:  1. Tylenol  1000 mg 3 times daily  2. Robaxin  500 mg q 8 hours PRN 3. Oxycodone  2.5-5 mg q 4 hours PRN 4. Dilaudid  0.5 mg q 4 hours PRN VTE prophylaxis: Lovenox , SCDs ID: Vancomycin  post op completed Foley/Lines:  No foley, KVO IVFs Impediments to Fracture Healing: Vitamin D  level 25, start supplementation Dispo: PT/OT evaluation ongoing. Recommending CIR.  Ortho issues stable.  Okay for discharge from ortho standpoint once cleared by medicine team and therapies  D/C recommendations: - Oxycodone , Robaxin , Tylenol  for pain control - Eliquis  2.5 mg twice daily x 30 days for DVT prophylaxis - 1000 IU Vit D3 supplementation daily x 90 days  Follow - up plan: 2 weeks after d/c for wound check and repeat x-rays   Contact information:  Franky Light MD, Lauraine Moores PA-C. After hours and holidays please check [email or web address] for group call information for [facility]   Lauraine PATRIC Moores, PA-C [phone] (office) [email or web address]

## 2024-03-10 NOTE — Progress Notes (Signed)
 Inpatient Rehab Admissions:  Inpatient Rehab Consult received.  I met with patient at the bedside for rehabilitation assessment and to discuss goals and expectations of an inpatient rehab admission.  Discussed average length of stay, insurance authorization requirement and discharge home after completion of CIR. Pt acknowledged understanding and is interested in pursuing CIR. Pt reported he would have intermittent support after discharge. Will discuss dispo with admissions team. Will continue to follow.  Signed: Tinnie Yvone Cohens, MS, CCC-SLP Admissions Coordinator [phone]

## 2024-03-11 ENCOUNTER — Inpatient Hospital Stay (HOSPITAL_COMMUNITY)

## 2024-03-11 LAB — CBC WITH DIFFERENTIAL/PLATELET
Abs Immature Granulocytes: 0.02 10*3/uL (ref 0.00–0.07)
Basophils Absolute: 0 10*3/uL (ref 0.0–0.1)
Basophils Relative: 1 %
Eosinophils Absolute: 0.3 10*3/uL (ref 0.0–0.5)
Eosinophils Relative: 3 %
HCT: 28 % — ABNORMAL LOW (ref 39.0–52.0)
Hemoglobin: 9.5 g/dL — ABNORMAL LOW (ref 13.0–17.0)
Immature Granulocytes: 0 %
Lymphocytes Relative: 21 %
Lymphs Abs: 1.6 10*3/uL (ref 0.7–4.0)
MCH: 32.1 pg (ref 26.0–34.0)
MCHC: 33.9 g/dL (ref 30.0–36.0)
MCV: 94.6 fL (ref 80.0–100.0)
Monocytes Absolute: 0.7 10*3/uL (ref 0.1–1.0)
Monocytes Relative: 10 %
Neutro Abs: 5 10*3/uL (ref 1.7–7.7)
Neutrophils Relative %: 65 %
Platelets: 275 10*3/uL (ref 150–400)
RBC: 2.96 MIL/uL — ABNORMAL LOW (ref 4.22–5.81)
RDW: 13.9 % (ref 11.5–15.5)
WBC: 7.6 10*3/uL (ref 4.0–10.5)
nRBC: 0 % (ref 0.0–0.2)

## 2024-03-11 LAB — URINALYSIS, ROUTINE W REFLEX MICROSCOPIC
Bacteria, UA: NONE SEEN
Bilirubin Urine: NEGATIVE
Glucose, UA: NEGATIVE mg/dL
Hgb urine dipstick: NEGATIVE
Ketones, ur: NEGATIVE mg/dL
Nitrite: NEGATIVE
Protein, ur: NEGATIVE mg/dL
Specific Gravity, Urine: 1.015 (ref 1.005–1.030)
pH: 5 (ref 5.0–8.0)

## 2024-03-11 LAB — BASIC METABOLIC PANEL WITH GFR
Anion gap: 8 (ref 5–15)
BUN: 74 mg/dL — ABNORMAL HIGH (ref 8–23)
CO2: 26 mmol/L (ref 22–32)
Calcium: 8.6 mg/dL — ABNORMAL LOW (ref 8.9–10.3)
Chloride: 104 mmol/L (ref 98–111)
Creatinine, Ser: 2.58 mg/dL — ABNORMAL HIGH (ref 0.61–1.24)
GFR, Estimated: 24 mL/min — ABNORMAL LOW
Glucose, Bld: 103 mg/dL — ABNORMAL HIGH (ref 70–99)
Potassium: 4.7 mmol/L (ref 3.5–5.1)
Sodium: 139 mmol/L (ref 135–145)

## 2024-03-11 MED ORDER — SODIUM CHLORIDE 0.9 % IV SOLN: INTRAVENOUS | Status: DC

## 2024-03-11 MED ORDER — OXYCODONE HCL 5 MG PO TABS
5.0000 mg | ORAL_TABLET | Freq: Four times a day (QID) | ORAL | Status: DC | PRN
  Administered 2024-03-11 – 2024-03-14 (×4): 10 mg via ORAL
  Filled 2024-03-11 (×4): qty 2

## 2024-03-11 NOTE — [place] FL2 (Signed)
 " [place]  MEDICAID FL2 LEVEL OF CARE FORM     IDENTIFICATION  Patient Name: Tyler Macdonald Birthdate: Feb 23, 1940 Sex: male Admission Date (Current Location): 03/08/2024  [facility] and [place] Number:  [employer] and Address:  [facility], [address] [place], [place] [address]      Provider Number: [id number]  Attending Physician Name and Address:  Donnamarie Lebron PARAS, MD  Relative Name and Phone Number:  Dorsel, Flinn  Daughter, Emergency Contact  [phone]    Current Level of Care: Hospital Recommended Level of Care: Skilled Nursing Facility Prior Approval Number:    Date Approved/Denied:   PASRR Number: [id number]  Discharge Plan: SNF    Current Diagnoses: Patient Active Problem List   Diagnosis Date Noted   Closed right hip fracture, initial encounter (HCC) 03/08/2024   Closed right hip fracture (HCC) 03/08/2024   Hypertension, accelerated, with diastolic congestive heart failure, NYHA class 1 (HCC) 02/08/2019   Coronary artery calcification seen on CAT scan 02/08/2019   Essential hypertension 02/07/2019   Cough with sputum 02/07/2019   Gout 02/07/2019   Shortness of breath 02/07/2019    Orientation RESPIRATION BLADDER Height & Weight     Self, Time, Place, Situation  Normal Continent Weight: 270 lb (122.5 kg) Height:  6' 7 (200.7 cm)  BEHAVIORAL SYMPTOMS/MOOD NEUROLOGICAL BOWEL NUTRITION STATUS      Continent Diet (Please see dc summary)  AMBULATORY STATUS COMMUNICATION OF NEEDS Skin   Extensive Assist Verbally Surgical wounds (Surgical Closed Surgical Incision Hip)                       Personal Care Assistance Level of Assistance  Bathing, Feeding, Dressing Bathing Assistance: Limited assistance Feeding assistance: Independent Dressing Assistance: Limited assistance     Functional Limitations Info  Sight, Hearing, Speech Sight Info: Adequate Hearing Info: Adequate Speech Info: Adequate     SPECIAL CARE FACTORS FREQUENCY  PT (By licensed PT), OT (By licensed OT)     PT Frequency: 5x week OT Frequency: 5x week            Contractures Contractures Info: Not present    Additional Factors Info  Code Status, Allergies, Isolation Precautions Code Status Info: Full Allergies Info: Cephalexin, Penicillin G     Isolation Precautions Info: Contact pre MRSA     Current Medications (03/11/2024):  This is the current hospital active medication list Current Facility-Administered Medications  Medication Dose Route Frequency Provider Last Rate Last Admin   acetaminophen  (TYLENOL ) tablet 1,000 mg  1,000 mg Oral TID Dahal, Binaya, MD   1,000 mg at 03/11/24 1032   allopurinol  (ZYLOPRIM ) tablet 300 mg  300 mg Oral Daily Odell Celinda Balo, MD   300 mg at 03/11/24 1032   aspirin  EC tablet 81 mg  81 mg Oral Daily Odell Celinda Balo, MD   81 mg at 03/11/24 1032   Chlorhexidine  Gluconate Cloth 2 % PADS 6 each  6 each Topical Daily Odell Celinda Balo, MD   6 each at 03/11/24 1032   diphenhydrAMINE  (BENADRYL ) 12.5 MG/5ML elixir 12.5-25 mg  12.5-25 mg Oral Q4H PRN Danton Lauraine LABOR, PA-C       enoxaparin  (LOVENOX ) injection 40 mg  40 mg Subcutaneous Q24H Dahal, Binaya, MD   40 mg at 03/11/24 1031   feeding supplement (ENSURE PLUS HIGH PROTEIN) liquid 237 mL  237 mL Oral BID BM Dahal, Chapman, MD   237 mL at 03/11/24 1032  fluticasone  (FLONASE ) 50 MCG/ACT nasal spray 1 spray  1 spray Each Nare Daily PRN Odell Celinda Balo, MD       hydrALAZINE  (APRESOLINE ) tablet 25 mg  25 mg Oral Q6H PRN Odell Celinda Balo, MD       HYDROmorphone  (DILAUDID ) injection 0.5 mg  0.5 mg Intravenous Q4H PRN Dahal, Chapman, MD   0.5 mg at 03/10/24 0804   methocarbamol  (ROBAXIN ) tablet 500 mg  500 mg Oral Q8H PRN Danton Lauraine LABOR, PA-C   500 mg at 03/08/24 1958   Or   methocarbamol  (ROBAXIN ) injection 500 mg  500 mg Intravenous Q8H PRN Danton Lauraine LABOR, PA-C       metoprolol  succinate (TOPROL -XL) 24 hr  tablet 25 mg  25 mg Oral Daily Odell Celinda Balo, MD   25 mg at 03/11/24 1032   multivitamin with minerals tablet 1 tablet  1 tablet Oral Daily Dahal, Chapman, MD   1 tablet at 03/11/24 1032   mupirocin  ointment (BACTROBAN ) 2 % 1 Application  1 Application Nasal BID Odell Celinda Balo, MD   1 Application at 03/11/24 1032   nutrition supplement (JUVEN) (JUVEN) powder packet 1 packet  1 packet Oral BID BM Odell Celinda Balo, MD   1 packet at 03/11/24 1031   oxyCODONE  (Oxy IR/ROXICODONE ) immediate release tablet 2.5-5 mg  2.5-5 mg Oral Q4H PRN Danton Lauraine A, PA-C       polyethylene glycol (MIRALAX  / GLYCOLAX ) packet 17 g  17 g Oral Daily PRN Danton, Sarah A, PA-C       prochlorperazine  (COMPAZINE ) injection 5 mg  5 mg Intravenous Q6H PRN Opyd, Evalene RAMAN, MD       rosuvastatin  (CRESTOR ) tablet 40 mg  40 mg Oral q1800 Dahal, Binaya, MD   40 mg at 03/10/24 1740   senna-docusate (Senokot-S) tablet 1 tablet  1 tablet Oral BID Dahal, Chapman, MD   1 tablet at 03/11/24 1031   Vitamin D  (Ergocalciferol ) (DRISDOL ) 1.25 MG (50000 UNIT) capsule 50,000 Units  50,000 Units Oral Q7 days Danton Lauraine LABOR, PA-C   50,000 Units at 03/10/24 1019     Discharge Medications: Please see discharge summary for a list of discharge medications.  Relevant Imaging Results:  Relevant Lab Results:   Additional Information SSN [id number]; using patients Medicare a and b - subscriber # [id number]  Luise JAYSON Pan, LCSWA     "

## 2024-03-11 NOTE — Progress Notes (Signed)
 Inpatient Rehab Admissions Coordinator:  Discussed dispo with physiatrist. Physiatrist feels intermittent support after discharge is not sufficient. Updated pt who again stated that he doesn't have anyone that would be able to provide 24/7 support after discharge. Other rehab venues should be pursued. TOC made aware.   Tinnie Yvone Cohens, MS, CCC-SLP Admissions Coordinator [phone]

## 2024-03-11 NOTE — TOC Progression Note (Addendum)
 Transition of Care [facility]) - Progression Note    Patient Details  Name: Tyler Macdonald MRN: [id number] Date of Birth: September 18, 1940  [facility] CM/SW Contact  Luise JAYSON Pan, [place] Phone Number: 03/11/2024, 10:33 AM  Clinical Narrative:   CIR informed CSW that patient will need to look at other rehab ventures. CSW followed up with patients daughter Tyler Macdonald, about PT rec for SNF. Tyler Macdonald is agreeable but stated she defers decision making to her dad on which facility he would like to go to. Per Tyler Macdonald [facility] would be ideal as she lives/works in that area; however patient lives in [place]. CSW explained SNF referral process. Keely stated at this time, patient cannot care for himself at home. CSW discussed utilizing patients Medicare part a and b insurance to maximize facility options, Tyler Macdonald is agreeable. CSW attempted to speak with patient but patient is unavailable at this time via phone. CSW to follow up with bed offers.   CSW will continue to follow.    Expected Discharge Plan: Skilled Nursing Facility Barriers to Discharge: Continued Medical Work up, SNF Pending bed offer               Expected Discharge Plan and Services In-house Referral: Clinical Social Work   Post Acute Care Choice: Skilled Nursing Facility Living arrangements for the past 2 months: Single Family Home                                       Social Drivers of Health (SDOH) Interventions SDOH Screenings   Food Insecurity: No Food Insecurity (03/08/2024)  Housing: Low Risk (03/08/2024)  Transportation Needs: No Transportation Needs (03/08/2024)  Utilities: Not At Risk (03/08/2024)  Financial Resource Strain: Low Risk (11/23/2022)   Received from [facility]  Social Connections: Unknown (03/10/2024)  Tobacco Use: Unknown (03/08/2024)    Readmission Risk Interventions     No data to display

## 2024-03-11 NOTE — Progress Notes (Signed)
 " PROGRESS NOTE  Tyler Macdonald  DOB: May 21, 1940  PCP: Kayla Drivers, MD FMW:[id number]  DOA: 03/08/2024  LOS: 3 days  Hospital Day: 4    Brief narrative: Tyler Macdonald is a 84 y.o. male with PMH significant for obesity, HTN, HLD, CVA, gout. On 1/28, patient presented to the ED after a mechanical fall leading to right hip pain. Reports impact to the head but did not lose consciousness. In the ED, afebrile, hemodynamically stable. Labs showed WBC 14.2. Right hip x-ray showed mildly displaced greater and lesser trochanter fracture. CT head unremarkable for acute abnormalities, showed moderate cerebral and cerebellar volume loss, chronic lacunar infarcts. CT C-spine did not show acute fracture or dislocation, CT abdomen pelvis showed mildly displaced right intertrochanteric fracture. Orthopedics consulted. Patient admitted for further management.    Patient denies any new complaints. Not eligible for CIR, will purse SNF.    Assessment and plan: Closed right hip fracture S/p cephalomedullary nailing 01/28 Secondary to mechanical fall from loss of balance CT abdomen pelvis showed mildly displaced right intertrochanteric fracture Pain management, DVT ppx as per Orthopedics PT/OT  Impaired mobility Fall No acute injury to head or cervical spine per CT PT/OT, fall precautions  AKI on CKD 3a Baseline creatinine <1.3 from 2024 [facility] system On admission 1.27, currently 2.58 Bladder scan pending, renal ultrasound pending Start IV fluids  Hypertension BP stable PTA meds- continue Toprol  25 mg daily, hold losartan  100 mg daily  H/o CVA HLD Continue aspirin  and Crestor   Gout Continue allopurinol   Anxiety/depression No longer taking Zoloft   Nutrition Status: Nutrition Problem: Increased nutrient needs Etiology: hip fracture, post-op healing Signs/Symptoms: estimated needs Interventions: Refer to RD note for recommendations   Goals of care   Code Status: Full Code      DVT prophylaxis:  enoxaparin  (LOVENOX ) injection 40 mg Start: 03/09/24 1000 SCDs Start: 03/08/24 1434   Antimicrobials: None Fluid: IVF Consultants: Orthopedics Family Communication: None at bedside  Status: Inpatient Level of care:  Med-Surg   Patient is from: Home Anticipated d/c to: SNF      Diet:  Diet Order             Diet regular Room service appropriate? Yes with Assist; Fluid consistency: Thin  Diet effective now                   Scheduled Meds:  acetaminophen   1,000 mg Oral TID   allopurinol   300 mg Oral Daily   aspirin  EC  81 mg Oral Daily   Chlorhexidine  Gluconate Cloth  6 each Topical Daily   enoxaparin  (LOVENOX ) injection  40 mg Subcutaneous Q24H   feeding supplement  237 mL Oral BID BM   metoprolol  succinate  25 mg Oral Daily   multivitamin with minerals  1 tablet Oral Daily   mupirocin  ointment  1 Application Nasal BID   nutrition supplement (JUVEN)  1 packet Oral BID BM   rosuvastatin   40 mg Oral q1800   senna-docusate  1 tablet Oral BID   Vitamin D  (Ergocalciferol )  50,000 Units Oral Q7 days    PRN meds: diphenhydrAMINE , fluticasone , hydrALAZINE , HYDROmorphone  (DILAUDID ) injection, methocarbamol  **OR** methocarbamol  (ROBAXIN ) injection, oxyCODONE , polyethylene glycol, prochlorperazine    Infusions: IVF   Objective: Vitals:   03/11/24 0454 03/11/24 0753  BP: (!) 128/57 125/61  Pulse: (!) 56 61  Resp: 17 16  Temp: 98 F (36.7 C) 98.4 F (36.9 C)  SpO2: 96% 96%   No intake or output data in the  24 hours ending 03/11/24 1158  Filed Weights   03/08/24 0958  Weight: 122.5 kg   Weight change:  Body mass index is 30.42 kg/m.   Physical Exam: General: NAD  Cardiovascular: S1, S2 present Respiratory: CTAB Abdomen: Soft, nontender, nondistended, bowel sounds present Musculoskeletal: No bilateral pedal edema noted Skin: Normal Psychiatry: Normal mood   Data Review: I have personally reviewed the laboratory data and  studies available.     Signed, Lebron JINNY Cage, MD [facility] 03/11/2024  "

## 2024-03-12 LAB — BASIC METABOLIC PANEL WITH GFR
Anion gap: 6 (ref 5–15)
BUN: 69 mg/dL — ABNORMAL HIGH (ref 8–23)
CO2: 27 mmol/L (ref 22–32)
Calcium: 8.7 mg/dL — ABNORMAL LOW (ref 8.9–10.3)
Chloride: 107 mmol/L (ref 98–111)
Creatinine, Ser: 1.85 mg/dL — ABNORMAL HIGH (ref 0.61–1.24)
GFR, Estimated: 36 mL/min — ABNORMAL LOW
Glucose, Bld: 113 mg/dL — ABNORMAL HIGH (ref 70–99)
Potassium: 4.8 mmol/L (ref 3.5–5.1)
Sodium: 140 mmol/L (ref 135–145)

## 2024-03-12 LAB — CBC WITH DIFFERENTIAL/PLATELET
Abs Immature Granulocytes: 0.02 10*3/uL (ref 0.00–0.07)
Basophils Absolute: 0 10*3/uL (ref 0.0–0.1)
Basophils Relative: 1 %
Eosinophils Absolute: 0.3 10*3/uL (ref 0.0–0.5)
Eosinophils Relative: 5 %
HCT: 29.3 % — ABNORMAL LOW (ref 39.0–52.0)
Hemoglobin: 9.6 g/dL — ABNORMAL LOW (ref 13.0–17.0)
Immature Granulocytes: 0 %
Lymphocytes Relative: 21 %
Lymphs Abs: 1.4 10*3/uL (ref 0.7–4.0)
MCH: 31.5 pg (ref 26.0–34.0)
MCHC: 32.8 g/dL (ref 30.0–36.0)
MCV: 96.1 fL (ref 80.0–100.0)
Monocytes Absolute: 0.7 10*3/uL (ref 0.1–1.0)
Monocytes Relative: 11 %
Neutro Abs: 4.2 10*3/uL (ref 1.7–7.7)
Neutrophils Relative %: 62 %
Platelets: 273 10*3/uL (ref 150–400)
RBC: 3.05 MIL/uL — ABNORMAL LOW (ref 4.22–5.81)
RDW: 14 % (ref 11.5–15.5)
WBC: 6.7 10*3/uL (ref 4.0–10.5)
nRBC: 0 % (ref 0.0–0.2)

## 2024-03-12 NOTE — Progress Notes (Signed)
 " PROGRESS NOTE  Tyler Macdonald  DOB: Nov 18, 1940  PCP: Kayla Drivers, MD FMW:[id number]  DOA: 03/08/2024  LOS: 4 days  Hospital Day: 5    Brief narrative: Tyler Macdonald is a 84 y.o. male with PMH significant for obesity, HTN, HLD, CVA, gout. On 1/28, patient presented to the ED after a mechanical fall leading to right hip pain. Reports impact to the head but did not lose consciousness. In the ED, afebrile, hemodynamically stable. Labs showed WBC 14.2. Right hip x-ray showed mildly displaced greater and lesser trochanter fracture. CT head unremarkable for acute abnormalities, showed moderate cerebral and cerebellar volume loss, chronic lacunar infarcts. CT C-spine did not show acute fracture or dislocation, CT abdomen pelvis showed mildly displaced right intertrochanteric fracture. Orthopedics consulted. Patient admitted for further management.    Patient denies any new complaints. Pending SNF placement    Assessment and plan: Closed right hip fracture S/p cephalomedullary nailing 01/28 Secondary to mechanical fall from loss of balance CT abdomen pelvis showed mildly displaced right intertrochanteric fracture Pain management, DVT ppx as per Orthopedics PT/OT  Impaired mobility Fall No acute injury to head or cervical spine per CT PT/OT, fall precautions  AKI on CKD 3a Baseline creatinine <1.3 from 2024 [facility] On admission 1.27, currently 2.58 Renal ultrasound unremarkable Continue IV fluids  Hypertension BP stable PTA meds- continue Toprol  25 mg daily, hold losartan  100 mg daily  H/o CVA HLD Continue aspirin  and Crestor   Gout Continue allopurinol   Anxiety/depression No longer taking Zoloft   Nutrition Status: Nutrition Problem: Increased nutrient needs Etiology: hip fracture, post-op healing Signs/Symptoms: estimated needs Interventions: Refer to RD note for recommendations   Goals of care   Code Status: Full Code     DVT prophylaxis:  enoxaparin   (LOVENOX ) injection 40 mg Start: 03/09/24 1000 SCDs Start: 03/08/24 1434   Antimicrobials: None Fluid: IVF Consultants: Orthopedics Family Communication: None at bedside  Status: Inpatient Level of care:  Med-Surg   Patient is from: Home Anticipated d/c to: SNF      Diet:  Diet Order             Diet regular Room service appropriate? Yes with Assist; Fluid consistency: Thin  Diet effective now                   Scheduled Meds:  acetaminophen   1,000 mg Oral TID   allopurinol   300 mg Oral Daily   aspirin  EC  81 mg Oral Daily   Chlorhexidine  Gluconate Cloth  6 each Topical Daily   enoxaparin  (LOVENOX ) injection  40 mg Subcutaneous Q24H   feeding supplement  237 mL Oral BID BM   metoprolol  succinate  25 mg Oral Daily   multivitamin with minerals  1 tablet Oral Daily   mupirocin  ointment  1 Application Nasal BID   nutrition supplement (JUVEN)  1 packet Oral BID BM   rosuvastatin   40 mg Oral q1800   senna-docusate  1 tablet Oral BID   Vitamin D  (Ergocalciferol )  50,000 Units Oral Q7 days    PRN meds: diphenhydrAMINE , fluticasone , hydrALAZINE , HYDROmorphone  (DILAUDID ) injection, methocarbamol  **OR** methocarbamol  (ROBAXIN ) injection, oxyCODONE , polyethylene glycol, prochlorperazine    Infusions: IVF  sodium chloride  100 mL/hr at 03/11/24 1846    Objective: Vitals:   03/12/24 0800 03/12/24 1509  BP: (!) 140/70 (!) 151/70  Pulse: 60 (!) 56  Resp: 17 17  Temp: 98.2 F (36.8 C) 98.2 F (36.8 C)  SpO2: 99% 96%    Intake/Output Summary (Last  24 hours) at 03/12/2024 1529 Last data filed at 03/12/2024 0000 Gross per 24 hour  Intake 60 ml  Output 720 ml  Net -660 ml    Filed Weights   03/08/24 0958  Weight: 122.5 kg   Weight change:  Body mass index is 30.42 kg/m.   Physical Exam: General: NAD  Cardiovascular: S1, S2 present Respiratory: CTAB Abdomen: Soft, nontender, nondistended, bowel sounds present Musculoskeletal: No bilateral pedal edema  noted Skin: Normal Psychiatry: Normal mood   Data Review: I have personally reviewed the laboratory data and studies available.     Signed, Lebron JINNY Cage, MD [facility] 03/12/2024  "

## 2024-03-12 NOTE — Plan of Care (Signed)

## 2024-03-12 NOTE — Progress Notes (Signed)
 Pt awoke confused overnight, did not know where he was etc-was easily reoriented. Bed alarm utilized, call bell placed within reach. Pt repositioned w ice pack on hip

## 2024-03-12 NOTE — Plan of Care (Signed)
" °  Problem: Skin Integrity: Goal: Demonstrates signs of wound healing without infection Outcome: Progressing   Problem: Safety: Goal: Ability to remain free from injury will improve Outcome: Progressing   "

## 2024-03-13 LAB — BASIC METABOLIC PANEL WITH GFR
Anion gap: 9 (ref 5–15)
BUN: 62 mg/dL — ABNORMAL HIGH (ref 8–23)
CO2: 24 mmol/L (ref 22–32)
Calcium: 8.8 mg/dL — ABNORMAL LOW (ref 8.9–10.3)
Chloride: 107 mmol/L (ref 98–111)
Creatinine, Ser: 1.63 mg/dL — ABNORMAL HIGH (ref 0.61–1.24)
GFR, Estimated: 42 mL/min — ABNORMAL LOW
Glucose, Bld: 106 mg/dL — ABNORMAL HIGH (ref 70–99)
Potassium: 5.5 mmol/L — ABNORMAL HIGH (ref 3.5–5.1)
Sodium: 140 mmol/L (ref 135–145)

## 2024-03-13 MED ORDER — SODIUM ZIRCONIUM CYCLOSILICATE 10 G PO PACK
10.0000 g | PACK | Freq: Two times a day (BID) | ORAL | Status: AC
  Administered 2024-03-13 (×2): 10 g via ORAL
  Filled 2024-03-13 (×2): qty 1

## 2024-03-13 NOTE — Progress Notes (Signed)
 Physical Therapy Treatment Patient Details Name: Tyler Macdonald MRN: [id number] DOB: 08-08-1940 Today's Date: 03/13/2024   History of Present Illness Pt is a 84 y.o. M who presents 03/08/2024 with a fall and right intertrochanteric femur fx s/p cephalomedullary nailing. Significant PMH: HTN, CVA, chronic dens fx, gout, HLD, CAD, obesity, B knee arthritis.    PT Comments  Pt with goal this session to transfer to bathroom for BM. Pt requiring modA + 2 to transition to edge of bed. Pt unable to stand to RW due to RLE weakness and pain. Utilized Herby for transfer, which pt tolerated well. Pt able to complete posterior peri care with set up assist and then positioned in recliner to eat lunch. Patient will benefit from continued inpatient follow up therapy, <3 hours/day to address deficits and maximize functional mobility.    If plan is discharge home, recommend the following: Two people to help with walking and/or transfers;Two people to help with bathing/dressing/bathroom   Can travel by private vehicle     No  Equipment Recommendations  Other (comment) (defer)    Recommendations for Other Services       Precautions / Restrictions Precautions Precautions: Fall Restrictions Weight Bearing Restrictions Per Provider Order: Yes RLE Weight Bearing Per Provider Order: Weight bearing as tolerated     Mobility  Bed Mobility Overal bed mobility: Needs Assistance Bed Mobility: Supine to Sit     Supine to sit: +2 for physical assistance, Mod assist     General bed mobility comments: assist to support R LE and raise trunk, increased time    Transfers Overall transfer level: Needs assistance Equipment used: Ambulation equipment used Transfers: Sit to/from Stand Sit to Stand: +2 physical assistance, Max assist           General transfer comment: Unsuccessful attempt to stand with RW. Utilized Stedy to stand from elevated bed height and comfort height toilet    Ambulation/Gait                    Stairs             Wheelchair Mobility     Tilt Bed    Modified Rankin (Stroke Patients Only)       Balance Overall balance assessment: Needs assistance   Sitting balance-Leahy Scale: Fair       Standing balance-Leahy Scale: Poor                              Communication Communication Communication: No apparent difficulties  Cognition Arousal: Alert Behavior During Therapy: WFL for tasks assessed/performed   PT - Cognitive impairments: No apparent impairments                         Following commands: Intact      Cueing Cueing Techniques: Verbal cues, Gestural cues  Exercises      General Comments        Pertinent Vitals/Pain Pain Assessment Pain Assessment: Faces Faces Pain Scale: Hurts even more Pain Location: R hip Pain Descriptors / Indicators: Aching, Guarding, Grimacing, Moaning Pain Intervention(s): Limited activity within patient's tolerance, Monitored during session, Premedicated before session    Home Living                          Prior Function            PT Goals (  current goals can now be found in the care plan section) Acute Rehab PT Goals Potential to Achieve Goals: Good Progress towards PT goals: Progressing toward goals    Frequency    Min 2X/week      PT Plan      Co-evaluation              AM-PAC PT 6 Clicks Mobility   Outcome Measure  Help needed turning from your back to your side while in a flat bed without using bedrails?: A Lot Help needed moving from lying on your back to sitting on the side of a flat bed without using bedrails?: A Lot Help needed moving to and from a bed to a chair (including a wheelchair)?: Total Help needed standing up from a chair using your arms (e.g., wheelchair or bedside chair)?: Total Help needed to walk in hospital room?: Total Help needed climbing 3-5 steps with a railing? : Total 6 Click Score: 8    End of  Session Equipment Utilized During Treatment: Gait belt Activity Tolerance: Patient limited by pain Patient left: in chair;with call bell/phone within reach;with chair alarm set Nurse Communication: Mobility status PT Visit Diagnosis: Pain;Difficulty in walking, not elsewhere classified (R26.2) Pain - Right/Left: Right Pain - part of body: Hip     Time: 8852-8769 PT Time Calculation (min) (ACUTE ONLY): 43 min  Charges:    $Therapeutic Activity: 23-37 mins PT General Charges $$ ACUTE PT VISIT: 1 Visit                     Aleck Daring, PT, DPT Acute Rehabilitation Services Office [phone]    Aleck ONEIDA Daring 03/13/2024, 1:10 PM

## 2024-03-13 NOTE — Plan of Care (Signed)
  Problem: Health Behavior/Discharge Planning: Goal: Ability to manage health-related needs will improve Outcome: Progressing   Problem: Clinical Measurements: Goal: Respiratory complications will improve Outcome: Progressing   Problem: Activity: Goal: Risk for activity intolerance will decrease Outcome: Progressing   

## 2024-03-13 NOTE — Progress Notes (Signed)
 " PROGRESS NOTE  Tyler Macdonald  DOB: [birth year]  PCP: Kayla Drivers, MD FMW:[id number]  DOA: 03/08/2024  LOS: 5 days  Hospital Day: 6    Brief narrative: Tyler Macdonald is a 84 y.o. male with PMH significant for obesity, HTN, HLD, CVA, gout. On 1/28, patient presented to the ED after a mechanical fall leading to right hip pain. Reports impact to the head but did not lose consciousness. In the ED, afebrile, hemodynamically stable. Labs showed WBC 14.2. Right hip x-ray showed mildly displaced greater and lesser trochanter fracture. CT head unremarkable for acute abnormalities, showed moderate cerebral and cerebellar volume loss, chronic lacunar infarcts. CT C-spine did not show acute fracture or dislocation, CT abdomen pelvis showed mildly displaced right intertrochanteric fracture. Orthopedics consulted. Patient admitted for further management.    Patient denies any new complaints today    Assessment and plan: Closed right hip fracture S/p cephalomedullary nailing 01/28 Secondary to mechanical fall from loss of balance CT abdomen pelvis showed mildly displaced right intertrochanteric fracture Pain management, DVT ppx as per Orthopedics PT/OT  Impaired mobility Fall No acute injury to head or cervical spine per CT PT/OT, fall precautions  AKI on CKD 3a Baseline creatinine <1.3 from 2024 [facility] system On admission 1.27-->2.58, will trend Renal ultrasound unremarkable Continue IV fluids  Hyperkalemia Mild Start Lokelma  BMP on am  Hypertension BP stable PTA meds- continue Toprol  25 mg daily, hold losartan  100 mg daily  H/o CVA HLD Continue aspirin  and Crestor   Gout Continue allopurinol   Anxiety/depression No longer taking Zoloft   Nutrition Status: Nutrition Problem: Increased nutrient needs Etiology: hip fracture, post-op healing Signs/Symptoms: estimated needs Interventions: Refer to RD note for recommendations   Goals of care   Code Status: Full Code      DVT prophylaxis:  enoxaparin  (LOVENOX ) injection 40 mg Start: 03/09/24 1000 SCDs Start: 03/08/24 1434   Antimicrobials: None Fluid: IVF Consultants: Orthopedics Family Communication: None at bedside  Status: Inpatient Level of care:  Med-Surg   Patient is from: Home Anticipated d/c to: SNF      Diet:  Diet Order             Diet regular Room service appropriate? Yes with Assist; Fluid consistency: Thin  Diet effective now                   Scheduled Meds:  acetaminophen   1,000 mg Oral TID   allopurinol   300 mg Oral Daily   aspirin  EC  81 mg Oral Daily   enoxaparin  (LOVENOX ) injection  40 mg Subcutaneous Q24H   feeding supplement  237 mL Oral BID BM   metoprolol  succinate  25 mg Oral Daily   multivitamin with minerals  1 tablet Oral Daily   mupirocin  ointment  1 Application Nasal BID   nutrition supplement (JUVEN)  1 packet Oral BID BM   rosuvastatin   40 mg Oral q1800   senna-docusate  1 tablet Oral BID   sodium zirconium cyclosilicate   10 g Oral BID   Vitamin D  (Ergocalciferol )  50,000 Units Oral Q7 days    PRN meds: diphenhydrAMINE , fluticasone , hydrALAZINE , methocarbamol  **OR** methocarbamol  (ROBAXIN ) injection, oxyCODONE , polyethylene glycol, prochlorperazine    Infusions: IVF  sodium chloride  100 mL/hr at 03/13/24 1358    Objective: Vitals:   03/13/24 0723 03/13/24 1549  BP: (!) 144/65 99/62  Pulse: 65   Resp: 17 17  Temp: 98.2 F (36.8 C) 97.6 F (36.4 C)  SpO2: 98% 94%    Intake/Output Summary (  Last 24 hours) at 03/13/2024 1744 Last data filed at 03/13/2024 1500 Gross per 24 hour  Intake 240 ml  Output 1425 ml  Net -1185 ml    Filed Weights   03/08/24 0958  Weight: 122.5 kg   Weight change:  Body mass index is 30.42 kg/m.   Physical Exam: General: NAD  Cardiovascular: S1, S2 present Respiratory: CTAB Abdomen: Soft, nontender, nondistended, bowel sounds present Musculoskeletal: No bilateral pedal edema noted Skin:  Normal Psychiatry: Normal mood   Data Review: I have personally reviewed the laboratory data and studies available.     Signed, Lebron JINNY Cage, MD [facility] 03/13/2024  "

## 2024-03-14 LAB — BASIC METABOLIC PANEL WITH GFR
Anion gap: 7 (ref 5–15)
BUN: 61 mg/dL — ABNORMAL HIGH (ref 8–23)
CO2: 25 mmol/L (ref 22–32)
Calcium: 9.1 mg/dL (ref 8.9–10.3)
Chloride: 108 mmol/L (ref 98–111)
Creatinine, Ser: 1.52 mg/dL — ABNORMAL HIGH (ref 0.61–1.24)
GFR, Estimated: 46 mL/min — ABNORMAL LOW
Glucose, Bld: 97 mg/dL (ref 70–99)
Potassium: 5.2 mmol/L — ABNORMAL HIGH (ref 3.5–5.1)
Sodium: 139 mmol/L (ref 135–145)

## 2024-03-14 MED ORDER — SODIUM ZIRCONIUM CYCLOSILICATE 10 G PO PACK
10.0000 g | PACK | Freq: Three times a day (TID) | ORAL | Status: DC
  Administered 2024-03-14: 10 g via ORAL
  Filled 2024-03-14: qty 1

## 2024-03-14 MED ORDER — SENNOSIDES-DOCUSATE SODIUM 8.6-50 MG PO TABS: 1.0000 | ORAL_TABLET | Freq: Two times a day (BID) | ORAL | Status: AC

## 2024-03-14 MED ORDER — POLYETHYLENE GLYCOL 3350 17 G PO PACK: 17.0000 g | PACK | Freq: Every day | ORAL | Status: AC | PRN

## 2024-03-14 MED ORDER — AMLODIPINE BESYLATE 5 MG PO TABS: 5.0000 mg | ORAL_TABLET | Freq: Every day | ORAL | Status: AC

## 2024-03-14 MED ORDER — ACETAMINOPHEN 325 MG PO TABS: 650.0000 mg | ORAL_TABLET | Freq: Four times a day (QID) | ORAL | Status: AC | PRN

## 2024-03-14 MED ORDER — JUVEN PO PACK: 1.0000 | PACK | Freq: Two times a day (BID) | ORAL | Status: AC

## 2024-03-14 MED ORDER — CYCLOBENZAPRINE HCL 10 MG PO TABS: 10.0000 mg | ORAL_TABLET | Freq: Every evening | ORAL | 0 refills | Status: AC | PRN

## 2024-03-14 MED ORDER — ENSURE PLUS HIGH PROTEIN PO LIQD: 237.0000 mL | Freq: Two times a day (BID) | ORAL | Status: AC

## 2024-03-14 MED ORDER — SODIUM ZIRCONIUM CYCLOSILICATE 10 G PO PACK: 10.0000 g | PACK | Freq: Three times a day (TID) | ORAL | Status: AC

## 2024-03-14 MED ORDER — VITAMIN D (ERGOCALCIFEROL) 1.25 MG (50000 UNIT) PO CAPS: 50000.0000 [IU] | ORAL_CAPSULE | ORAL | Status: AC

## 2024-03-14 MED ORDER — ADULT MULTIVITAMIN W/MINERALS CH: 1.0000 | ORAL_TABLET | Freq: Every day | ORAL | Status: AC

## 2024-03-14 NOTE — Plan of Care (Signed)
   Problem: Education: Goal: Knowledge of General Education information will improve Description: Including pain rating scale, medication(s)/side effects and non-pharmacologic comfort measures Outcome: Progressing   Problem: Clinical Measurements: Goal: Will remain free from infection Outcome: Progressing   Problem: Coping: Goal: Level of anxiety will decrease Outcome: Progressing
